# Patient Record
Sex: Male | Born: 1950
Health system: Midwestern US, Community
[De-identification: ages and names within clinical notes are randomized; demographics above are authoritative.]

## PROBLEM LIST (undated history)

## (undated) DIAGNOSIS — Z87442 Personal history of urinary calculi: Secondary | ICD-10-CM

## (undated) DIAGNOSIS — I10 Essential (primary) hypertension: Secondary | ICD-10-CM

## (undated) DIAGNOSIS — G4730 Sleep apnea, unspecified: Secondary | ICD-10-CM

## (undated) HISTORY — PX: OTHER SURGICAL HISTORY: SHX169

---

## 2005-09-26 ENCOUNTER — Ambulatory Visit (HOSPITAL_COMMUNITY): Admission: RE | Admit: 2005-09-26 | Discharge: 2005-09-26 | Payer: Self-pay | Admitting: Orthopedic Surgery

## 2010-12-04 ENCOUNTER — Ambulatory Visit
Admission: RE | Admit: 2010-12-04 | Discharge: 2010-12-04 | Disposition: A | Discharge status: Home / Self Care | Payer: 59 | Source: Ambulatory Visit | Attending: Radiation Oncology | Admitting: Radiation Oncology

## 2010-12-04 DIAGNOSIS — Y842 Radiological procedure and radiotherapy as the cause of abnormal reaction of the patient, or of later complication, without mention of misadventure at the time of the procedure: Secondary | ICD-10-CM | POA: Insufficient documentation

## 2010-12-04 DIAGNOSIS — C61 Malignant neoplasm of prostate: Secondary | ICD-10-CM | POA: Insufficient documentation

## 2010-12-04 DIAGNOSIS — N342 Other urethritis: Secondary | ICD-10-CM | POA: Insufficient documentation

## 2010-12-27 ENCOUNTER — Other Ambulatory Visit: Payer: Self-pay | Admitting: Urology

## 2010-12-27 ENCOUNTER — Ambulatory Visit (HOSPITAL_BASED_OUTPATIENT_CLINIC_OR_DEPARTMENT_OTHER)
Admission: RE | Admit: 2010-12-27 | Discharge: 2010-12-27 | Disposition: A | Discharge status: Home / Self Care | Payer: 59 | Source: Ambulatory Visit | Attending: Urology | Admitting: Urology

## 2010-12-27 ENCOUNTER — Ambulatory Visit
Admission: RE | Admit: 2010-12-27 | Discharge: 2010-12-27 | Disposition: A | Discharge status: Home / Self Care | Payer: 59 | Source: Ambulatory Visit | Attending: Urology | Admitting: Urology

## 2010-12-27 DIAGNOSIS — C61 Malignant neoplasm of prostate: Secondary | ICD-10-CM | POA: Insufficient documentation

## 2010-12-27 DIAGNOSIS — Z01811 Encounter for preprocedural respiratory examination: Secondary | ICD-10-CM

## 2011-01-16 LAB — PROTIME-INR
INR: 0.93 (ref 0.00–1.49)
Prothrombin Time: 12.7 seconds (ref 11.6–15.2)

## 2011-01-16 LAB — COMPREHENSIVE METABOLIC PANEL
ALT: 41 U/L (ref 0–53)
Albumin: 4.5 g/dL (ref 3.5–5.2)
Alkaline Phosphatase: 72 U/L (ref 39–117)
CO2: 29 mEq/L (ref 19–32)
Calcium: 10.5 mg/dL (ref 8.4–10.5)
GFR calc Af Amer: >60 mL/min (ref >60)
GFR calc non Af Amer: >60 mL/min (ref >60)
Glucose, Bld: 100 mg/dL — ABNORMAL HIGH (ref 70–99)
Potassium: 4.6 mEq/L (ref 3.5–5.1)
Sodium: 137 mEq/L (ref 135–145)

## 2011-01-16 LAB — CBC
Hemoglobin: 16.1 g/dL (ref 13.0–17.0)
MCH: 33.3 pg (ref 26.0–34.0)
RDW: 12.9 % (ref 11.5–15.5)

## 2011-01-16 LAB — APTT: aPTT: 33 seconds (ref 24–37)

## 2011-01-23 ENCOUNTER — Ambulatory Visit (HOSPITAL_BASED_OUTPATIENT_CLINIC_OR_DEPARTMENT_OTHER)
Admission: RE | Admit: 2011-01-23 | Discharge: 2011-01-23 | Disposition: A | Discharge status: Home / Self Care | Payer: 59 | Source: Ambulatory Visit | Attending: Urology | Admitting: Urology

## 2011-01-23 ENCOUNTER — Ambulatory Visit (HOSPITAL_COMMUNITY): Payer: 59 | Attending: Urology

## 2011-01-23 DIAGNOSIS — G4733 Obstructive sleep apnea (adult) (pediatric): Secondary | ICD-10-CM | POA: Insufficient documentation

## 2011-01-23 DIAGNOSIS — Z01812 Encounter for preprocedural laboratory examination: Secondary | ICD-10-CM | POA: Insufficient documentation

## 2011-01-23 DIAGNOSIS — Z79899 Other long term (current) drug therapy: Secondary | ICD-10-CM | POA: Insufficient documentation

## 2011-01-23 DIAGNOSIS — F17200 Nicotine dependence, unspecified, uncomplicated: Secondary | ICD-10-CM | POA: Insufficient documentation

## 2011-01-23 DIAGNOSIS — C61 Malignant neoplasm of prostate: Secondary | ICD-10-CM | POA: Insufficient documentation

## 2011-01-23 DIAGNOSIS — Z8042 Family history of malignant neoplasm of prostate: Secondary | ICD-10-CM | POA: Insufficient documentation

## 2011-01-23 DIAGNOSIS — I10 Essential (primary) hypertension: Secondary | ICD-10-CM | POA: Insufficient documentation

## 2011-01-23 DIAGNOSIS — Z01810 Encounter for preprocedural cardiovascular examination: Secondary | ICD-10-CM | POA: Insufficient documentation

## 2011-04-23 ENCOUNTER — Encounter: Payer: Self-pay | Admitting: *Deleted

## 2011-04-23 NOTE — Progress Notes (Unsigned)
ON 12/04/10 I=PSS RESULTS= 0100001 SCORE=2

## 2015-08-16 DIAGNOSIS — Z7689 Persons encountering health services in other specified circumstances: Secondary | ICD-10-CM | POA: Diagnosis not present

## 2015-08-16 DIAGNOSIS — C61 Malignant neoplasm of prostate: Secondary | ICD-10-CM | POA: Diagnosis not present

## 2015-08-16 DIAGNOSIS — N529 Male erectile dysfunction, unspecified: Secondary | ICD-10-CM | POA: Diagnosis not present

## 2015-09-26 DIAGNOSIS — Z1389 Encounter for screening for other disorder: Secondary | ICD-10-CM | POA: Diagnosis not present

## 2015-09-26 DIAGNOSIS — Z79899 Other long term (current) drug therapy: Secondary | ICD-10-CM | POA: Diagnosis not present

## 2015-09-26 DIAGNOSIS — G4730 Sleep apnea, unspecified: Secondary | ICD-10-CM | POA: Diagnosis not present

## 2015-09-26 DIAGNOSIS — Z8546 Personal history of malignant neoplasm of prostate: Secondary | ICD-10-CM | POA: Diagnosis not present

## 2015-09-26 DIAGNOSIS — F17200 Nicotine dependence, unspecified, uncomplicated: Secondary | ICD-10-CM | POA: Diagnosis not present

## 2015-09-26 DIAGNOSIS — E663 Overweight: Secondary | ICD-10-CM | POA: Diagnosis not present

## 2015-09-26 DIAGNOSIS — E785 Hyperlipidemia, unspecified: Secondary | ICD-10-CM | POA: Diagnosis not present

## 2015-09-26 DIAGNOSIS — M109 Gout, unspecified: Secondary | ICD-10-CM | POA: Diagnosis not present

## 2015-09-26 DIAGNOSIS — N529 Male erectile dysfunction, unspecified: Secondary | ICD-10-CM | POA: Diagnosis not present

## 2015-09-26 DIAGNOSIS — I10 Essential (primary) hypertension: Secondary | ICD-10-CM | POA: Diagnosis not present

## 2015-09-26 DIAGNOSIS — R739 Hyperglycemia, unspecified: Secondary | ICD-10-CM | POA: Diagnosis not present

## 2015-09-26 DIAGNOSIS — Z0000 Encounter for general adult medical examination without abnormal findings: Secondary | ICD-10-CM | POA: Diagnosis not present

## 2015-09-28 DIAGNOSIS — G4733 Obstructive sleep apnea (adult) (pediatric): Secondary | ICD-10-CM | POA: Diagnosis not present

## 2015-12-25 DIAGNOSIS — S239XXA Sprain of unspecified parts of thorax, initial encounter: Secondary | ICD-10-CM | POA: Diagnosis not present

## 2016-01-23 DIAGNOSIS — R05 Cough: Secondary | ICD-10-CM | POA: Diagnosis not present

## 2016-01-23 DIAGNOSIS — R0602 Shortness of breath: Secondary | ICD-10-CM | POA: Diagnosis not present

## 2016-01-23 DIAGNOSIS — R509 Fever, unspecified: Secondary | ICD-10-CM | POA: Diagnosis not present

## 2016-02-15 DIAGNOSIS — C61 Malignant neoplasm of prostate: Secondary | ICD-10-CM | POA: Diagnosis not present

## 2016-02-15 DIAGNOSIS — N529 Male erectile dysfunction, unspecified: Secondary | ICD-10-CM | POA: Diagnosis not present

## 2016-08-15 DIAGNOSIS — C61 Malignant neoplasm of prostate: Secondary | ICD-10-CM | POA: Diagnosis not present

## 2016-08-15 DIAGNOSIS — N3289 Other specified disorders of bladder: Secondary | ICD-10-CM | POA: Diagnosis not present

## 2016-09-26 DIAGNOSIS — E785 Hyperlipidemia, unspecified: Secondary | ICD-10-CM | POA: Diagnosis not present

## 2016-09-26 DIAGNOSIS — Z8546 Personal history of malignant neoplasm of prostate: Secondary | ICD-10-CM | POA: Diagnosis not present

## 2016-09-26 DIAGNOSIS — F17210 Nicotine dependence, cigarettes, uncomplicated: Secondary | ICD-10-CM | POA: Diagnosis not present

## 2016-09-26 DIAGNOSIS — E663 Overweight: Secondary | ICD-10-CM | POA: Diagnosis not present

## 2016-09-26 DIAGNOSIS — M109 Gout, unspecified: Secondary | ICD-10-CM | POA: Diagnosis not present

## 2016-09-26 DIAGNOSIS — I10 Essential (primary) hypertension: Secondary | ICD-10-CM | POA: Diagnosis not present

## 2016-09-26 DIAGNOSIS — L821 Other seborrheic keratosis: Secondary | ICD-10-CM | POA: Diagnosis not present

## 2016-09-26 DIAGNOSIS — Z6828 Body mass index (BMI) 28.0-28.9, adult: Secondary | ICD-10-CM | POA: Diagnosis not present

## 2016-09-26 DIAGNOSIS — Z0000 Encounter for general adult medical examination without abnormal findings: Secondary | ICD-10-CM | POA: Diagnosis not present

## 2016-09-26 DIAGNOSIS — G4730 Sleep apnea, unspecified: Secondary | ICD-10-CM | POA: Diagnosis not present

## 2016-11-13 DIAGNOSIS — L219 Seborrheic dermatitis, unspecified: Secondary | ICD-10-CM | POA: Diagnosis not present

## 2016-11-13 DIAGNOSIS — L821 Other seborrheic keratosis: Secondary | ICD-10-CM | POA: Diagnosis not present

## 2016-11-13 DIAGNOSIS — D220 Melanocytic nevi of lip: Secondary | ICD-10-CM | POA: Diagnosis not present

## 2016-11-29 DIAGNOSIS — M1711 Unilateral primary osteoarthritis, right knee: Secondary | ICD-10-CM | POA: Diagnosis not present

## 2017-02-14 DIAGNOSIS — C61 Malignant neoplasm of prostate: Secondary | ICD-10-CM | POA: Diagnosis not present

## 2017-02-14 DIAGNOSIS — N3289 Other specified disorders of bladder: Secondary | ICD-10-CM | POA: Diagnosis not present

## 2017-03-14 DIAGNOSIS — M1711 Unilateral primary osteoarthritis, right knee: Secondary | ICD-10-CM | POA: Diagnosis not present

## 2017-04-03 DIAGNOSIS — H00019 Hordeolum externum unspecified eye, unspecified eyelid: Secondary | ICD-10-CM | POA: Diagnosis not present

## 2017-04-16 DIAGNOSIS — H0014 Chalazion left upper eyelid: Secondary | ICD-10-CM | POA: Diagnosis not present

## 2017-06-26 DIAGNOSIS — M1711 Unilateral primary osteoarthritis, right knee: Secondary | ICD-10-CM | POA: Diagnosis not present

## 2017-06-26 DIAGNOSIS — M170 Bilateral primary osteoarthritis of knee: Secondary | ICD-10-CM | POA: Diagnosis not present

## 2017-08-15 DIAGNOSIS — N529 Male erectile dysfunction, unspecified: Secondary | ICD-10-CM | POA: Diagnosis not present

## 2017-08-15 DIAGNOSIS — N3289 Other specified disorders of bladder: Secondary | ICD-10-CM | POA: Diagnosis not present

## 2017-08-15 DIAGNOSIS — C61 Malignant neoplasm of prostate: Secondary | ICD-10-CM | POA: Diagnosis not present

## 2017-08-26 DIAGNOSIS — M1711 Unilateral primary osteoarthritis, right knee: Secondary | ICD-10-CM | POA: Diagnosis not present

## 2017-09-16 DIAGNOSIS — M1711 Unilateral primary osteoarthritis, right knee: Secondary | ICD-10-CM | POA: Diagnosis not present

## 2017-09-23 DIAGNOSIS — S83241D Other tear of medial meniscus, current injury, right knee, subsequent encounter: Secondary | ICD-10-CM | POA: Diagnosis not present

## 2017-09-23 DIAGNOSIS — M1711 Unilateral primary osteoarthritis, right knee: Secondary | ICD-10-CM | POA: Diagnosis not present

## 2017-09-25 DIAGNOSIS — G4733 Obstructive sleep apnea (adult) (pediatric): Secondary | ICD-10-CM | POA: Diagnosis not present

## 2017-10-10 DIAGNOSIS — R739 Hyperglycemia, unspecified: Secondary | ICD-10-CM | POA: Diagnosis not present

## 2017-10-10 DIAGNOSIS — E663 Overweight: Secondary | ICD-10-CM | POA: Diagnosis not present

## 2017-10-10 DIAGNOSIS — Z6827 Body mass index (BMI) 27.0-27.9, adult: Secondary | ICD-10-CM | POA: Diagnosis not present

## 2017-10-10 DIAGNOSIS — I10 Essential (primary) hypertension: Secondary | ICD-10-CM | POA: Diagnosis not present

## 2017-10-10 DIAGNOSIS — Z01818 Encounter for other preprocedural examination: Secondary | ICD-10-CM | POA: Diagnosis not present

## 2017-10-10 DIAGNOSIS — E785 Hyperlipidemia, unspecified: Secondary | ICD-10-CM | POA: Diagnosis not present

## 2017-10-10 DIAGNOSIS — M109 Gout, unspecified: Secondary | ICD-10-CM | POA: Diagnosis not present

## 2017-10-10 DIAGNOSIS — F17200 Nicotine dependence, unspecified, uncomplicated: Secondary | ICD-10-CM | POA: Diagnosis not present

## 2017-10-10 DIAGNOSIS — Z0000 Encounter for general adult medical examination without abnormal findings: Secondary | ICD-10-CM | POA: Diagnosis not present

## 2017-10-10 DIAGNOSIS — Z79899 Other long term (current) drug therapy: Secondary | ICD-10-CM | POA: Diagnosis not present

## 2017-10-10 DIAGNOSIS — G4730 Sleep apnea, unspecified: Secondary | ICD-10-CM | POA: Diagnosis not present

## 2017-10-10 DIAGNOSIS — Z8546 Personal history of malignant neoplasm of prostate: Secondary | ICD-10-CM | POA: Diagnosis not present

## 2017-10-27 DIAGNOSIS — M948X6 Other specified disorders of cartilage, lower leg: Secondary | ICD-10-CM | POA: Diagnosis not present

## 2017-10-27 DIAGNOSIS — Y999 Unspecified external cause status: Secondary | ICD-10-CM | POA: Diagnosis not present

## 2017-10-27 DIAGNOSIS — G8918 Other acute postprocedural pain: Secondary | ICD-10-CM | POA: Diagnosis not present

## 2017-10-27 DIAGNOSIS — X58XXXA Exposure to other specified factors, initial encounter: Secondary | ICD-10-CM | POA: Diagnosis not present

## 2017-10-27 DIAGNOSIS — M1711 Unilateral primary osteoarthritis, right knee: Secondary | ICD-10-CM | POA: Diagnosis not present

## 2017-10-27 DIAGNOSIS — S83231A Complex tear of medial meniscus, current injury, right knee, initial encounter: Secondary | ICD-10-CM | POA: Diagnosis not present

## 2017-10-27 DIAGNOSIS — S83281A Other tear of lateral meniscus, current injury, right knee, initial encounter: Secondary | ICD-10-CM | POA: Diagnosis not present

## 2017-12-12 DIAGNOSIS — M1712 Unilateral primary osteoarthritis, left knee: Secondary | ICD-10-CM | POA: Diagnosis not present

## 2017-12-12 DIAGNOSIS — M1711 Unilateral primary osteoarthritis, right knee: Secondary | ICD-10-CM | POA: Diagnosis not present

## 2017-12-12 DIAGNOSIS — Z4789 Encounter for other orthopedic aftercare: Secondary | ICD-10-CM | POA: Diagnosis not present

## 2018-02-03 DIAGNOSIS — M1711 Unilateral primary osteoarthritis, right knee: Secondary | ICD-10-CM | POA: Diagnosis not present

## 2018-02-10 DIAGNOSIS — M1711 Unilateral primary osteoarthritis, right knee: Secondary | ICD-10-CM | POA: Diagnosis not present

## 2018-02-16 DIAGNOSIS — N529 Male erectile dysfunction, unspecified: Secondary | ICD-10-CM | POA: Diagnosis not present

## 2018-02-16 DIAGNOSIS — N3289 Other specified disorders of bladder: Secondary | ICD-10-CM | POA: Diagnosis not present

## 2018-02-16 DIAGNOSIS — C61 Malignant neoplasm of prostate: Secondary | ICD-10-CM | POA: Diagnosis not present

## 2018-02-17 DIAGNOSIS — M1711 Unilateral primary osteoarthritis, right knee: Secondary | ICD-10-CM | POA: Diagnosis not present

## 2018-04-24 DIAGNOSIS — G4733 Obstructive sleep apnea (adult) (pediatric): Secondary | ICD-10-CM | POA: Diagnosis not present

## 2018-06-15 DIAGNOSIS — L820 Inflamed seborrheic keratosis: Secondary | ICD-10-CM | POA: Diagnosis not present

## 2018-06-15 DIAGNOSIS — D220 Melanocytic nevi of lip: Secondary | ICD-10-CM | POA: Diagnosis not present

## 2018-06-21 DIAGNOSIS — K5732 Diverticulitis of large intestine without perforation or abscess without bleeding: Secondary | ICD-10-CM | POA: Diagnosis not present

## 2018-07-08 DIAGNOSIS — N3001 Acute cystitis with hematuria: Secondary | ICD-10-CM | POA: Diagnosis not present

## 2018-07-08 DIAGNOSIS — Z6828 Body mass index (BMI) 28.0-28.9, adult: Secondary | ICD-10-CM | POA: Diagnosis not present

## 2018-07-08 DIAGNOSIS — M545 Low back pain: Secondary | ICD-10-CM | POA: Diagnosis not present

## 2018-07-08 DIAGNOSIS — K5900 Constipation, unspecified: Secondary | ICD-10-CM | POA: Diagnosis not present

## 2018-07-22 DIAGNOSIS — E663 Overweight: Secondary | ICD-10-CM | POA: Diagnosis not present

## 2018-07-22 DIAGNOSIS — K5900 Constipation, unspecified: Secondary | ICD-10-CM | POA: Diagnosis not present

## 2018-07-22 DIAGNOSIS — Z6828 Body mass index (BMI) 28.0-28.9, adult: Secondary | ICD-10-CM | POA: Diagnosis not present

## 2018-07-22 DIAGNOSIS — F17200 Nicotine dependence, unspecified, uncomplicated: Secondary | ICD-10-CM | POA: Diagnosis not present

## 2018-07-22 DIAGNOSIS — Z9181 History of falling: Secondary | ICD-10-CM | POA: Diagnosis not present

## 2018-07-22 DIAGNOSIS — Z8546 Personal history of malignant neoplasm of prostate: Secondary | ICD-10-CM | POA: Diagnosis not present

## 2018-07-22 DIAGNOSIS — M545 Low back pain: Secondary | ICD-10-CM | POA: Diagnosis not present

## 2018-07-22 DIAGNOSIS — Z1331 Encounter for screening for depression: Secondary | ICD-10-CM | POA: Diagnosis not present

## 2018-07-22 DIAGNOSIS — I10 Essential (primary) hypertension: Secondary | ICD-10-CM | POA: Diagnosis not present

## 2018-08-12 DIAGNOSIS — I10 Essential (primary) hypertension: Secondary | ICD-10-CM | POA: Diagnosis not present

## 2018-08-12 DIAGNOSIS — R29898 Other symptoms and signs involving the musculoskeletal system: Secondary | ICD-10-CM | POA: Diagnosis not present

## 2018-08-12 DIAGNOSIS — Z789 Other specified health status: Secondary | ICD-10-CM | POA: Diagnosis not present

## 2018-08-12 DIAGNOSIS — I129 Hypertensive chronic kidney disease with stage 1 through stage 4 chronic kidney disease, or unspecified chronic kidney disease: Secondary | ICD-10-CM | POA: Diagnosis not present

## 2018-08-12 DIAGNOSIS — N419 Inflammatory disease of prostate, unspecified: Secondary | ICD-10-CM | POA: Diagnosis not present

## 2018-08-12 DIAGNOSIS — Z6828 Body mass index (BMI) 28.0-28.9, adult: Secondary | ICD-10-CM | POA: Diagnosis not present

## 2018-09-03 DIAGNOSIS — N3289 Other specified disorders of bladder: Secondary | ICD-10-CM | POA: Diagnosis not present

## 2018-09-03 DIAGNOSIS — C61 Malignant neoplasm of prostate: Secondary | ICD-10-CM | POA: Diagnosis not present

## 2018-09-03 DIAGNOSIS — R1032 Left lower quadrant pain: Secondary | ICD-10-CM | POA: Diagnosis not present

## 2018-09-04 DIAGNOSIS — N200 Calculus of kidney: Secondary | ICD-10-CM | POA: Diagnosis not present

## 2018-09-04 DIAGNOSIS — R1032 Left lower quadrant pain: Secondary | ICD-10-CM | POA: Diagnosis not present

## 2018-09-15 DIAGNOSIS — R109 Unspecified abdominal pain: Secondary | ICD-10-CM | POA: Diagnosis not present

## 2018-09-15 DIAGNOSIS — N200 Calculus of kidney: Secondary | ICD-10-CM | POA: Diagnosis not present

## 2018-09-18 DIAGNOSIS — N200 Calculus of kidney: Secondary | ICD-10-CM | POA: Diagnosis not present

## 2018-09-24 DIAGNOSIS — F17210 Nicotine dependence, cigarettes, uncomplicated: Secondary | ICD-10-CM | POA: Diagnosis not present

## 2018-09-24 DIAGNOSIS — F17200 Nicotine dependence, unspecified, uncomplicated: Secondary | ICD-10-CM | POA: Diagnosis not present

## 2018-09-24 DIAGNOSIS — R1032 Left lower quadrant pain: Secondary | ICD-10-CM | POA: Diagnosis not present

## 2018-09-24 DIAGNOSIS — N200 Calculus of kidney: Secondary | ICD-10-CM | POA: Diagnosis not present

## 2018-09-24 DIAGNOSIS — N390 Urinary tract infection, site not specified: Secondary | ICD-10-CM | POA: Diagnosis not present

## 2018-09-24 DIAGNOSIS — Z6828 Body mass index (BMI) 28.0-28.9, adult: Secondary | ICD-10-CM | POA: Diagnosis not present

## 2018-09-25 DIAGNOSIS — R1032 Left lower quadrant pain: Secondary | ICD-10-CM | POA: Diagnosis not present

## 2018-09-25 DIAGNOSIS — N401 Enlarged prostate with lower urinary tract symptoms: Secondary | ICD-10-CM | POA: Diagnosis not present

## 2018-09-25 DIAGNOSIS — N200 Calculus of kidney: Secondary | ICD-10-CM | POA: Diagnosis not present

## 2018-10-20 DIAGNOSIS — Z79899 Other long term (current) drug therapy: Secondary | ICD-10-CM | POA: Diagnosis not present

## 2018-10-20 DIAGNOSIS — F17200 Nicotine dependence, unspecified, uncomplicated: Secondary | ICD-10-CM | POA: Diagnosis not present

## 2018-10-20 DIAGNOSIS — Z0000 Encounter for general adult medical examination without abnormal findings: Secondary | ICD-10-CM | POA: Diagnosis not present

## 2018-10-20 DIAGNOSIS — Z125 Encounter for screening for malignant neoplasm of prostate: Secondary | ICD-10-CM | POA: Diagnosis not present

## 2018-10-20 DIAGNOSIS — Z8546 Personal history of malignant neoplasm of prostate: Secondary | ICD-10-CM | POA: Diagnosis not present

## 2018-10-20 DIAGNOSIS — F17210 Nicotine dependence, cigarettes, uncomplicated: Secondary | ICD-10-CM | POA: Diagnosis not present

## 2018-10-20 DIAGNOSIS — G4730 Sleep apnea, unspecified: Secondary | ICD-10-CM | POA: Diagnosis not present

## 2018-10-20 DIAGNOSIS — E785 Hyperlipidemia, unspecified: Secondary | ICD-10-CM | POA: Diagnosis not present

## 2018-10-20 DIAGNOSIS — M109 Gout, unspecified: Secondary | ICD-10-CM | POA: Diagnosis not present

## 2018-10-20 DIAGNOSIS — E663 Overweight: Secondary | ICD-10-CM | POA: Diagnosis not present

## 2018-10-20 DIAGNOSIS — I10 Essential (primary) hypertension: Secondary | ICD-10-CM | POA: Diagnosis not present

## 2018-10-20 DIAGNOSIS — Z6827 Body mass index (BMI) 27.0-27.9, adult: Secondary | ICD-10-CM | POA: Diagnosis not present

## 2018-10-29 DIAGNOSIS — C61 Malignant neoplasm of prostate: Secondary | ICD-10-CM | POA: Diagnosis not present

## 2018-10-29 DIAGNOSIS — N3289 Other specified disorders of bladder: Secondary | ICD-10-CM | POA: Diagnosis not present

## 2018-10-29 DIAGNOSIS — N200 Calculus of kidney: Secondary | ICD-10-CM | POA: Diagnosis not present

## 2018-12-22 DIAGNOSIS — D485 Neoplasm of uncertain behavior of skin: Secondary | ICD-10-CM | POA: Diagnosis not present

## 2018-12-22 DIAGNOSIS — L814 Other melanin hyperpigmentation: Secondary | ICD-10-CM | POA: Diagnosis not present

## 2018-12-22 DIAGNOSIS — L820 Inflamed seborrheic keratosis: Secondary | ICD-10-CM | POA: Diagnosis not present

## 2019-01-19 DIAGNOSIS — M1711 Unilateral primary osteoarthritis, right knee: Secondary | ICD-10-CM | POA: Diagnosis not present

## 2019-01-26 DIAGNOSIS — M1711 Unilateral primary osteoarthritis, right knee: Secondary | ICD-10-CM | POA: Diagnosis not present

## 2019-02-02 DIAGNOSIS — M1711 Unilateral primary osteoarthritis, right knee: Secondary | ICD-10-CM | POA: Diagnosis not present

## 2019-02-07 DIAGNOSIS — R1084 Generalized abdominal pain: Secondary | ICD-10-CM | POA: Diagnosis not present

## 2019-03-01 DIAGNOSIS — N200 Calculus of kidney: Secondary | ICD-10-CM | POA: Diagnosis not present

## 2019-03-01 DIAGNOSIS — C61 Malignant neoplasm of prostate: Secondary | ICD-10-CM | POA: Diagnosis not present

## 2019-06-07 DIAGNOSIS — E663 Overweight: Secondary | ICD-10-CM | POA: Diagnosis not present

## 2019-06-07 DIAGNOSIS — Z8546 Personal history of malignant neoplasm of prostate: Secondary | ICD-10-CM | POA: Diagnosis not present

## 2019-06-07 DIAGNOSIS — Z6828 Body mass index (BMI) 28.0-28.9, adult: Secondary | ICD-10-CM | POA: Diagnosis not present

## 2019-06-07 DIAGNOSIS — G4730 Sleep apnea, unspecified: Secondary | ICD-10-CM | POA: Diagnosis not present

## 2019-06-07 DIAGNOSIS — F17200 Nicotine dependence, unspecified, uncomplicated: Secondary | ICD-10-CM | POA: Diagnosis not present

## 2019-06-07 DIAGNOSIS — F17210 Nicotine dependence, cigarettes, uncomplicated: Secondary | ICD-10-CM | POA: Diagnosis not present

## 2019-06-07 DIAGNOSIS — K5792 Diverticulitis of intestine, part unspecified, without perforation or abscess without bleeding: Secondary | ICD-10-CM | POA: Diagnosis not present

## 2019-08-09 DIAGNOSIS — M1711 Unilateral primary osteoarthritis, right knee: Secondary | ICD-10-CM | POA: Diagnosis not present

## 2019-08-16 DIAGNOSIS — M1711 Unilateral primary osteoarthritis, right knee: Secondary | ICD-10-CM | POA: Diagnosis not present

## 2019-08-23 DIAGNOSIS — M1711 Unilateral primary osteoarthritis, right knee: Secondary | ICD-10-CM | POA: Diagnosis not present

## 2019-08-30 DIAGNOSIS — N200 Calculus of kidney: Secondary | ICD-10-CM | POA: Diagnosis not present

## 2019-08-30 DIAGNOSIS — C61 Malignant neoplasm of prostate: Secondary | ICD-10-CM | POA: Diagnosis not present

## 2019-10-02 DIAGNOSIS — K5700 Diverticulitis of small intestine with perforation and abscess without bleeding: Secondary | ICD-10-CM | POA: Diagnosis not present

## 2019-10-02 DIAGNOSIS — R1084 Generalized abdominal pain: Secondary | ICD-10-CM | POA: Diagnosis not present

## 2019-10-03 DIAGNOSIS — K5732 Diverticulitis of large intestine without perforation or abscess without bleeding: Secondary | ICD-10-CM | POA: Diagnosis not present

## 2019-11-05 DIAGNOSIS — R21 Rash and other nonspecific skin eruption: Secondary | ICD-10-CM | POA: Diagnosis not present

## 2019-11-05 DIAGNOSIS — Z6828 Body mass index (BMI) 28.0-28.9, adult: Secondary | ICD-10-CM | POA: Diagnosis not present

## 2019-11-05 DIAGNOSIS — G4730 Sleep apnea, unspecified: Secondary | ICD-10-CM | POA: Diagnosis not present

## 2019-11-05 DIAGNOSIS — E663 Overweight: Secondary | ICD-10-CM | POA: Diagnosis not present

## 2019-11-05 DIAGNOSIS — E785 Hyperlipidemia, unspecified: Secondary | ICD-10-CM | POA: Diagnosis not present

## 2019-11-05 DIAGNOSIS — M109 Gout, unspecified: Secondary | ICD-10-CM | POA: Diagnosis not present

## 2019-11-05 DIAGNOSIS — I10 Essential (primary) hypertension: Secondary | ICD-10-CM | POA: Diagnosis not present

## 2019-11-05 DIAGNOSIS — F17200 Nicotine dependence, unspecified, uncomplicated: Secondary | ICD-10-CM | POA: Diagnosis not present

## 2019-11-05 DIAGNOSIS — Z0000 Encounter for general adult medical examination without abnormal findings: Secondary | ICD-10-CM | POA: Diagnosis not present

## 2019-11-05 DIAGNOSIS — R739 Hyperglycemia, unspecified: Secondary | ICD-10-CM | POA: Diagnosis not present

## 2019-11-05 DIAGNOSIS — Z8546 Personal history of malignant neoplasm of prostate: Secondary | ICD-10-CM | POA: Diagnosis not present

## 2019-11-05 DIAGNOSIS — Z79899 Other long term (current) drug therapy: Secondary | ICD-10-CM | POA: Diagnosis not present

## 2019-11-05 DIAGNOSIS — F17210 Nicotine dependence, cigarettes, uncomplicated: Secondary | ICD-10-CM | POA: Diagnosis not present

## 2019-11-24 DIAGNOSIS — K5730 Diverticulosis of large intestine without perforation or abscess without bleeding: Secondary | ICD-10-CM | POA: Diagnosis not present

## 2019-11-24 DIAGNOSIS — Z1211 Encounter for screening for malignant neoplasm of colon: Secondary | ICD-10-CM | POA: Diagnosis not present

## 2019-11-24 DIAGNOSIS — Z86010 Personal history of colonic polyps: Secondary | ICD-10-CM | POA: Diagnosis not present

## 2019-12-14 DIAGNOSIS — M25561 Pain in right knee: Secondary | ICD-10-CM | POA: Diagnosis not present

## 2019-12-14 DIAGNOSIS — M25461 Effusion, right knee: Secondary | ICD-10-CM | POA: Diagnosis not present

## 2019-12-14 DIAGNOSIS — M1711 Unilateral primary osteoarthritis, right knee: Secondary | ICD-10-CM | POA: Diagnosis not present

## 2019-12-27 DIAGNOSIS — L219 Seborrheic dermatitis, unspecified: Secondary | ICD-10-CM | POA: Diagnosis not present

## 2019-12-27 DIAGNOSIS — L820 Inflamed seborrheic keratosis: Secondary | ICD-10-CM | POA: Diagnosis not present

## 2019-12-27 DIAGNOSIS — L301 Dyshidrosis [pompholyx]: Secondary | ICD-10-CM | POA: Diagnosis not present

## 2019-12-28 DIAGNOSIS — M1711 Unilateral primary osteoarthritis, right knee: Secondary | ICD-10-CM | POA: Diagnosis not present

## 2020-01-31 ENCOUNTER — Other Ambulatory Visit: Payer: Self-pay | Admitting: Orthopedic Surgery

## 2020-02-14 ENCOUNTER — Encounter (HOSPITAL_COMMUNITY): Payer: Self-pay

## 2020-02-21 ENCOUNTER — Ambulatory Visit: Admit: 2020-02-21 | Payer: Self-pay | Admitting: Orthopedic Surgery

## 2020-02-21 SURGERY — ARTHROPLASTY, KNEE, TOTAL
Anesthesia: Spinal | Site: Knee | Laterality: Right

## 2020-02-29 DIAGNOSIS — C61 Malignant neoplasm of prostate: Secondary | ICD-10-CM | POA: Diagnosis not present

## 2020-02-29 DIAGNOSIS — N200 Calculus of kidney: Secondary | ICD-10-CM | POA: Diagnosis not present

## 2020-03-02 DIAGNOSIS — I878 Other specified disorders of veins: Secondary | ICD-10-CM | POA: Diagnosis not present

## 2020-03-02 DIAGNOSIS — Z87442 Personal history of urinary calculi: Secondary | ICD-10-CM | POA: Diagnosis not present

## 2020-03-02 DIAGNOSIS — N200 Calculus of kidney: Secondary | ICD-10-CM | POA: Diagnosis not present

## 2020-04-16 DIAGNOSIS — R051 Acute cough: Secondary | ICD-10-CM | POA: Diagnosis not present

## 2020-04-16 DIAGNOSIS — R9081 Abnormal echoencephalogram: Secondary | ICD-10-CM | POA: Diagnosis not present

## 2020-04-16 DIAGNOSIS — J309 Allergic rhinitis, unspecified: Secondary | ICD-10-CM | POA: Diagnosis not present

## 2020-05-04 DIAGNOSIS — M1711 Unilateral primary osteoarthritis, right knee: Secondary | ICD-10-CM | POA: Diagnosis not present

## 2020-08-29 DIAGNOSIS — N200 Calculus of kidney: Secondary | ICD-10-CM | POA: Diagnosis not present

## 2020-08-29 DIAGNOSIS — C61 Malignant neoplasm of prostate: Secondary | ICD-10-CM | POA: Diagnosis not present

## 2020-11-05 DIAGNOSIS — M109 Gout, unspecified: Secondary | ICD-10-CM | POA: Diagnosis not present

## 2020-11-05 DIAGNOSIS — M25562 Pain in left knee: Secondary | ICD-10-CM | POA: Diagnosis not present

## 2020-11-10 ENCOUNTER — Ambulatory Visit: Attending: Orthopaedic Surgery

## 2020-11-10 ENCOUNTER — Encounter: Admit: 2020-11-10 | Payer: PRIVATE HEALTH INSURANCE

## 2020-11-10 ENCOUNTER — Ambulatory Visit: Admit: 2020-11-10 | Discharge: 2020-11-10 | Payer: PRIVATE HEALTH INSURANCE | Attending: Orthopaedic Surgery

## 2020-11-10 DIAGNOSIS — M25562 Pain in left knee: Secondary | ICD-10-CM

## 2020-11-10 DIAGNOSIS — M11262 Other chondrocalcinosis, left knee: Secondary | ICD-10-CM | POA: Diagnosis not present

## 2020-11-10 DIAGNOSIS — M1712 Unilateral primary osteoarthritis, left knee: Secondary | ICD-10-CM | POA: Diagnosis not present

## 2020-11-10 MED ORDER — BUPIVACAINE 0.5 % (5 MG/ML) IJ SOLN
0.5 % (5 mg/mL) | Freq: Once | INTRAMUSCULAR | Status: AC
Start: 2020-11-10 — End: 2020-11-10
  Administered 2020-11-10: 16:00:00

## 2020-11-10 MED ORDER — TRIAMCINOLONE ACETONIDE 40 MG/ML SUSP FOR INJECTION
40 mg/mL | Freq: Once | INTRAMUSCULAR | Status: AC
Start: 2020-11-10 — End: 2020-11-10
  Administered 2020-11-10: 16:00:00 via INTRA_ARTICULAR

## 2020-11-10 MED ORDER — BUPIVACAINE 0.5 % (5 MG/ML) IJ SOLN
0.5 % (5 mg/mL) | Freq: Once | INTRAMUSCULAR | Status: AC
Start: 2020-11-10 — End: 2020-11-10
  Administered 2020-11-10: 16:00:00 via SUBCUTANEOUS

## 2020-11-10 NOTE — Progress Notes (Signed)
Progress Notes by Harriette Bouillon, PA-C at 11/10/20 1145                Author: Harriette Bouillon, PA-C  Service: --  Author Type: Physician Assistant       Filed: 12/31/20 1811  Encounter Date: 11/10/2020  Status: Signed           Editor: Coral Ceo (Physician Assistant)  Cosigner: Sullivan Lone, MD at 01/03/21 0827                    George Avery (DOB: [birth year]) is a 70 y.o. male, patient, here for evaluation of the following chief complaint(s):   Knee Pain (left)          SUBJECTIVE/OBJECTIVE:   George Avery presents today complaining of left knee pain and swelling.  States he went to urgent care this past weekend.  Had what sounds like a Toradol injection IM with mild relief.         PHYSICAL EXAM:   Vitals: Ht 5\' 11"  (1.803 m)    Wt 190 lb (86.2 kg)    BMI 26.50 kg/m??   Body mass index is 26.5 kg/m??.      70 y.o. year old M in no acute distress.  Ambulates with a limp.  Left knee effusion.  Skin warm, dry.  Motor 5/5.  No distal edema.      IMAGING:   Radiographs: XR Results (most recent) :   Results from Appointment encounter on 11/10/20      XR KNEE LT MIN 4 V      Narrative   Four views (AP, PA-flex, lateral & sunrise) of the left knee were taken and reviewed today using digital radiography which reveal severe patellofemoral osteoarthritis, chondrocalcinosis  noted in the medial and lateral joint spaces.  Incidental finding complete loss of medial joint space right knee.           ASSESSMENT/PLAN:   1. Left knee pain, unspecified chronicity   -     XR KNEE LT MIN 4 V; Future   2. Primary osteoarthritis of left knee   -     bupivacaine HCl (MARCAINE) 0.5 % (5 mg/mL) injection 25 mg; 25 mg (5 mL), Other, ONCE, 1 dose, On Fri 11/10/20 at 1300   -     triamcinolone acetonide (KENALOG-40) 40 mg/mL injection 40 mg; 40 mg, Intra artICUlar, ONCE, 1 dose, On Fri 11/10/20 at 1300   3. Effusion, left knee   -     bupivacaine HCl (MARCAINE) 0.5 % (5 mg/mL) injection 10 mg; 10 mg (2  mL), SubCUTAneous, ONCE, 1 dose, On Fri 11/10/20 at 1300      The xray and exam findings were discussed with the patient today.  Severe left knee osteoarthritis complicated by effusion.  He elects for aspiration and cortisone injection today.  Discussed natural  disease progression and possible need for surgery in the future.  Follow-up as symptoms dictate.      Under sterile conditions, the knee as anesthetized with 2cc  0.5% bupivacaine with a 22g needle.  Knee was then aspirated using an 18g needle, 40 cc of fluid removed, and knee injected with 5cc 0.5%  Sensorcaine and 1cc 40mg  Kenalog, tolerated the procedure well.         Return if symptoms worsen or fail to improve.      Review Of Systems     ROS  Positive for: Musculoskeletal   Last edited by Jaclyn Shaggy, RN on 11/10/2020  1:27 PM.               Patient denies any recent fever, chills, nausea, vomiting, chest pain, or shortness of breath.      No Known Allergies        Current Outpatient Medications        Medication  Sig         ?  allopurinoL (ZYLOPRIM) 300 mg tablet  Take 300 mg by mouth daily.     ?  benazepriL (LOTENSIN) 40 mg tablet  benazepril 40 mg tablet     ?  amLODIPine (NORVASC) 5 mg tablet  amlodipine 5 mg tablet     ?  indomethacin (INDOCIN) 50 mg capsule  TAKE 1 CAPSULE 3 TIMES A DAY         ?  pravastatin (PRAVACHOL) 40 mg tablet            No current facility-administered medications for this visit.             Past Medical History:        Diagnosis  Date         ?  Gout       ?  Hypercholesteremia           ?  Hypertension              History reviewed. No pertinent surgical history.      History reviewed. No pertinent family history.         Social History          Socioeconomic History         ?  Marital status:  UNKNOWN              Spouse name:  Not on file         ?  Number of children:  Not on file     ?  Years of education:  Not on file     ?  Highest education level:  Not on file       Occupational History        ?  Not on  file       Tobacco Use         ?  Smoking status:  Every Day     ?  Smokeless tobacco:  Never       Substance and Sexual Activity         ?  Alcohol use:  Not on file     ?  Drug use:  Not on file     ?  Sexual activity:  Not on file        Other Topics  Concern        ?  Not on file       Social History Narrative        ?  Not on file          Social Determinants of Health          Financial Resource Strain: Not on file     Food Insecurity: Not on file     Transportation Needs: Not on file     Physical Activity: Not on file     Stress: Not on file     Social Connections: Not on file     Intimate Partner Violence: Not on file  Housing Stability: Not on file             Orders Placed This Encounter        ?  XR KNEE LT MIN 4 V              Standing Status:    Future         Number of Occurrences:    1         Standing Expiration Date:    11/11/2021        ?  bupivacaine HCl (MARCAINE) 0.5 % (5 mg/mL) injection 25 mg     ?  triamcinolone acetonide (KENALOG-40) 40 mg/mL injection 40 mg        ?  bupivacaine HCl (MARCAINE) 0.5 % (5 mg/mL) injection 10 mg         Jason R. Rennie Plowman, M.D. was available for immediate consultation as the supervising physician.   An electronic signature was used to authenticate this note.   -- Harriette Bouillon, PA-C

## 2020-11-16 DIAGNOSIS — M1711 Unilateral primary osteoarthritis, right knee: Secondary | ICD-10-CM | POA: Diagnosis not present

## 2020-11-26 DIAGNOSIS — R109 Unspecified abdominal pain: Secondary | ICD-10-CM | POA: Diagnosis not present

## 2020-11-26 DIAGNOSIS — D72829 Elevated white blood cell count, unspecified: Secondary | ICD-10-CM | POA: Diagnosis not present

## 2020-11-26 DIAGNOSIS — R3129 Other microscopic hematuria: Secondary | ICD-10-CM | POA: Diagnosis not present

## 2020-11-26 DIAGNOSIS — R1032 Left lower quadrant pain: Secondary | ICD-10-CM | POA: Diagnosis not present

## 2020-11-26 DIAGNOSIS — Z5321 Procedure and treatment not carried out due to patient leaving prior to being seen by health care provider: Secondary | ICD-10-CM | POA: Diagnosis not present

## 2020-11-26 DIAGNOSIS — R1030 Lower abdominal pain, unspecified: Secondary | ICD-10-CM | POA: Diagnosis not present

## 2020-11-29 DIAGNOSIS — Z6826 Body mass index (BMI) 26.0-26.9, adult: Secondary | ICD-10-CM | POA: Diagnosis not present

## 2020-11-29 DIAGNOSIS — I10 Essential (primary) hypertension: Secondary | ICD-10-CM | POA: Diagnosis not present

## 2020-11-29 DIAGNOSIS — Z0000 Encounter for general adult medical examination without abnormal findings: Secondary | ICD-10-CM | POA: Diagnosis not present

## 2020-11-29 DIAGNOSIS — F17210 Nicotine dependence, cigarettes, uncomplicated: Secondary | ICD-10-CM | POA: Diagnosis not present

## 2020-11-29 DIAGNOSIS — Z8546 Personal history of malignant neoplasm of prostate: Secondary | ICD-10-CM | POA: Diagnosis not present

## 2020-11-29 DIAGNOSIS — M1A09X0 Idiopathic chronic gout, multiple sites, without tophus (tophi): Secondary | ICD-10-CM | POA: Diagnosis not present

## 2020-11-29 DIAGNOSIS — E663 Overweight: Secondary | ICD-10-CM | POA: Diagnosis not present

## 2020-11-29 DIAGNOSIS — G4730 Sleep apnea, unspecified: Secondary | ICD-10-CM | POA: Diagnosis not present

## 2020-11-29 DIAGNOSIS — E785 Hyperlipidemia, unspecified: Secondary | ICD-10-CM | POA: Diagnosis not present

## 2020-12-04 DIAGNOSIS — M1712 Unilateral primary osteoarthritis, left knee: Secondary | ICD-10-CM | POA: Diagnosis not present

## 2020-12-04 DIAGNOSIS — R739 Hyperglycemia, unspecified: Secondary | ICD-10-CM | POA: Diagnosis not present

## 2020-12-04 DIAGNOSIS — E785 Hyperlipidemia, unspecified: Secondary | ICD-10-CM | POA: Diagnosis not present

## 2020-12-12 ENCOUNTER — Other Ambulatory Visit: Payer: Self-pay | Admitting: Orthopedic Surgery

## 2020-12-12 DIAGNOSIS — M25562 Pain in left knee: Secondary | ICD-10-CM

## 2020-12-25 ENCOUNTER — Other Ambulatory Visit: Payer: Self-pay

## 2020-12-25 ENCOUNTER — Ambulatory Visit
Admission: RE | Admit: 2020-12-25 | Discharge: 2020-12-25 | Disposition: A | Discharge status: Home / Self Care | Payer: Self-pay | Source: Ambulatory Visit | Attending: Orthopedic Surgery | Admitting: Orthopedic Surgery

## 2020-12-25 DIAGNOSIS — M25562 Pain in left knee: Secondary | ICD-10-CM | POA: Diagnosis not present

## 2021-01-03 DIAGNOSIS — M25562 Pain in left knee: Secondary | ICD-10-CM | POA: Diagnosis not present

## 2021-01-22 ENCOUNTER — Ambulatory Visit: Payer: Self-pay | Admitting: Orthopedic Surgery

## 2021-01-23 NOTE — Progress Notes (Addendum)
DUE TO COVID-19 ONLY ONE VISITOR IS ALLOWED TO COME WITH YOU AND STAY IN THE WAITING ROOM ONLY DURING PRE OP AND PROCEDURE DAY OF SURGERY.  2 VISITOR  MAY VISIT WITH YOU AFTER SURGERY IN YOUR PRIVATE ROOM DURING VISITING HOURS ONLY!  YOU NEED TO HAVE A COVID 19 TEST ON______AME@_  @_from  8am-3pm _____, THIS TEST MUST BE DONE BEFORE SURGERY,  Covid test is done at [place], [place] [address].  This is a drive thru.  No appt required. Please see map.                 Your procedure is scheduled on:  02/02/2021   Report to [facility] Main  Entrance   Report to admitting at    1100AM     Call this number if you have problems the morning of surgery [phone]    REMEMBER: NO  SOLID FOOD CANDY OR GUM AFTER MIDNIGHT. CLEAR LIQUIDS UNTIL    1000 am       . NOTHING BY MOUTH EXCEPT CLEAR LIQUIDS UNTIL      1000am  . PLEASE FINISH ENSURE DRINK PER SURGEON ORDER  WHICH NEEDS TO BE COMPLETED AT   1000am    .      CLEAR LIQUID DIET   Foods Allowed                                                                    Coffee and tea, regular and decaf                            Fruit ices (not with fruit pulp)                                      Iced Popsicles                                    Carbonated beverages, regular and diet                                    Cranberry, grape and apple juices Sports drinks like Gatorade Lightly seasoned clear broth or consume(fat free) Sugar, honey syrup ___________________________________________________________________      BRUSH YOUR TEETH MORNING OF SURGERY AND RINSE YOUR MOUTH OUT, NO CHEWING GUM CANDY OR MINTS.     Take these medicines the morning of surgery with A SIP OF WATER:    Allopurinol, amlodipine   DO NOT TAKE ANY DIABETIC MEDICATIONS DAY OF YOUR SURGERY                               You may not have any metal on your body including hair pins and              piercings  Do not wear jewelry, make-up,  lotions, powders or perfumes, deodorant  Do not wear nail polish on your fingernails.  Do not shave  48 hours prior to surgery.              Men may shave face and neck.   Do not bring valuables to the hospital. [place].  Contacts, dentures or bridgework may not be worn into surgery.  Leave suitcase in the car. After surgery it may be brought to your room.     Patients discharged the day of surgery will not be allowed to drive home. IF YOU ARE HAVING SURGERY AND GOING HOME THE SAME DAY, YOU MUST HAVE AN ADULT TO DRIVE YOU HOME AND BE WITH YOU FOR 24 HOURS. YOU MAY GO HOME BY TAXI OR UBER OR ORTHERWISE, BUT AN ADULT MUST ACCOMPANY YOU HOME AND STAY WITH YOU FOR 24 HOURS.  Name and phone number of your driver:  Special Instructions: N/A              Please read over the following fact sheets you were given: _____________________________________________________________________  [facility] - Preparing for Surgery Before surgery, you can play an important role.  Because skin is not sterile, your skin needs to be as free of germs as possible.  You can reduce the number of germs on your skin by washing with CHG (chlorahexidine gluconate) soap before surgery.  CHG is an antiseptic cleaner which kills germs and bonds with the skin to continue killing germs even after washing. Please DO NOT use if you have an allergy to CHG or antibacterial soaps.  If your skin becomes reddened/irritated stop using the CHG and inform your nurse when you arrive at Short Stay. Do not shave (including legs and underarms) for at least 48 hours prior to the first CHG shower.  You may shave your face/neck. Please follow these instructions carefully:  1.  Shower with CHG Soap the night before surgery and the  morning of Surgery.  2.  If you choose to wash your hair, wash your hair first as usual with your  normal  shampoo.  3.  After you shampoo, rinse your hair  and body thoroughly to remove the  shampoo.                           4.  Use CHG as you would any other liquid soap.  You can apply chg directly  to the skin and wash                       Gently with a scrungie or clean washcloth.  5.  Apply the CHG Soap to your body ONLY FROM THE NECK DOWN.   Do not use on face/ open                           Wound or open sores. Avoid contact with eyes, ears mouth and genitals (private parts).                       Wash face,  Genitals (private parts) with your normal soap.             6.  Wash thoroughly, paying special attention to the area where your surgery  will be performed.  7.  Thoroughly rinse your body with  warm water from the neck down.  8.  DO NOT shower/wash with your normal soap after using and rinsing off  the CHG Soap.                9.  Pat yourself dry with a clean towel.            10.  Wear clean pajamas.            11.  Place clean sheets on your bed the night of your first shower and do not  sleep with pets. Day of Surgery : Do not apply any lotions/deodorants the morning of surgery.  Please wear clean clothes to the hospital/surgery center.  FAILURE TO FOLLOW THESE INSTRUCTIONS MAY RESULT IN THE CANCELLATION OF YOUR SURGERY PATIENT SIGNATURE_________________________________  NURSE SIGNATURE__________________________________  ________________________________________________________________________

## 2021-01-26 ENCOUNTER — Encounter (HOSPITAL_COMMUNITY): Payer: Self-pay

## 2021-01-26 ENCOUNTER — Encounter (HOSPITAL_COMMUNITY)
Admission: RE | Admit: 2021-01-26 | Discharge: 2021-01-26 | Disposition: A | Discharge status: Home / Self Care | Payer: PPO | Source: Ambulatory Visit | Attending: Specialist | Admitting: Specialist

## 2021-01-26 ENCOUNTER — Other Ambulatory Visit: Payer: Self-pay

## 2021-01-26 DIAGNOSIS — Z01818 Encounter for other preprocedural examination: Secondary | ICD-10-CM | POA: Insufficient documentation

## 2021-01-26 HISTORY — DX: Essential (primary) hypertension: I10

## 2021-01-26 HISTORY — DX: Personal history of urinary calculi: Z87.442

## 2021-01-26 HISTORY — DX: Sleep apnea, unspecified: G47.30

## 2021-01-26 LAB — CBC
HCT: 54.5 % — ABNORMAL HIGH (ref 39.0–52.0)
Hemoglobin: 18.2 g/dL — ABNORMAL HIGH (ref 13.0–17.0)
MCH: 33 pg (ref 26.0–34.0)
MCHC: 33.4 g/dL (ref 30.0–36.0)
MCV: 98.9 fL (ref 80.0–100.0)
Platelets: 221 10*3/uL (ref 150–400)
RBC: 5.51 MIL/uL (ref 4.22–5.81)
RDW: 13.6 % (ref 11.5–15.5)
WBC: 10.4 10*3/uL (ref 4.0–10.5)
nRBC: 0 % (ref 0.0–0.2)

## 2021-01-26 LAB — COMPREHENSIVE METABOLIC PANEL
ALT: 43 U/L (ref 0–44)
AST: 51 U/L — ABNORMAL HIGH (ref 15–41)
Albumin: 4.4 g/dL (ref 3.5–5.0)
Alkaline Phosphatase: 71 U/L (ref 38–126)
Anion gap: 12 (ref 5–15)
BUN: 10 mg/dL (ref 8–23)
CO2: 27 mmol/L (ref 22–32)
Calcium: 9.6 mg/dL (ref 8.9–10.3)
Chloride: 99 mmol/L (ref 98–111)
Creatinine, Ser: 0.83 mg/dL (ref 0.61–1.24)
GFR, Estimated: >60 mL/min (ref >60)
Glucose, Bld: 96 mg/dL (ref 70–99)
Potassium: 4 mmol/L (ref 3.5–5.1)
Sodium: 138 mmol/L (ref 135–145)
Total Bilirubin: 1.1 mg/dL (ref 0.3–1.2)
Total Protein: 7.4 g/dL (ref 6.5–8.1)

## 2021-01-26 NOTE — Progress Notes (Addendum)
Anesthesia Review:  PCP: DR christopher Street LOV 11/29/20 LVMM to Medical Records and requested LOV note. LOV 11/29/20 on chart  Cardiologist : none  Chest x-ray : EKG : 01/26/21  Echo : Stress test: Cardiac Cath :  Activity level: can do a flight of stairs without difficulty  Sleep Study/ CPAP : has cpap  Fasting Blood Sugar :      / Checks Blood Sugar -- times a day:   Blood Thinner/ Instructions /Last Dose: ASA / Instructions/ Last Dose :   Ambulatory- no covid  Smoker Cbc done 01/26/21 routed to DR Beane. Jessica Ward, [facility] made aware of hct 18 and labs routed.  No new orders given.

## 2021-02-01 ENCOUNTER — Ambulatory Visit: Payer: Self-pay | Admitting: Orthopedic Surgery

## 2021-02-01 NOTE — H&P (View-Only) (Signed)
Majid Mccravy is an 70 y.o. male.   Chief Complaint: left knee pain HPI: For location, patient reports left and knee. For duration, patient reports 2 months. For quality, patient reports aching and improving. For severity, patient reports moderate. For alleviating factors, patient reports rest and narcotics (hydrocodone). For aggravating factors, patient reports weightbearing. For previous surgery, patient reports none. For prior imaging, patient reports x ray. For previous injections, patient reports helped temporarily. For previous pt, patient reports none. For work related, patient reports no.  Past Medical History:  Diagnosis Date   History of kidney stones    Hypertension    Sleep apnea     Past Surgical History:  Procedure Laterality Date   cyst removd right ear      left knee scope      seed implant to knee       No family history on file. Social History:  reports that he has been smoking cigarettes. He has a 35.00 pack-year smoking history. He has never used smokeless tobacco. He reports current alcohol use. He reports that he does not use drugs.  Allergies: No Known Allergies  (Not in a hospital admission)   No results found for this or any previous visit (from the past 48 hour(s)). No results found.  Review of Systems  Constitutional: Negative.   HENT: Negative.    Eyes: Negative.   Respiratory: Negative.    Cardiovascular: Negative.   Gastrointestinal: Negative.   Endocrine: Negative.   Genitourinary: Negative.   Musculoskeletal:  Positive for arthralgias, gait problem and joint swelling.  Skin: Negative.    There were no vitals taken for this visit. Physical Exam Constitutional:      Appearance: Normal appearance.  HENT:     Head: Normocephalic.     Right Ear: External ear normal.     Left Ear: External ear normal.     Nose: Nose normal.     Mouth/Throat:     Pharynx: Oropharynx is clear.  Eyes:     Conjunctiva/sclera: Conjunctivae normal.   Cardiovascular:     Rate and Rhythm: Normal rate and regular rhythm.     Pulses: Normal pulses.  Pulmonary:     Effort: Pulmonary effort is normal.     Breath sounds: Normal breath sounds.  Abdominal:     General: Bowel sounds are normal.  Musculoskeletal:     Cervical back: Normal range of motion.     Comments: Examination left knee is tender medial joint line. Equivocal McMurray. He has pain in the lateral gutter. And a loose body palpable in the subcutaneous tissue. Ranges -3-1 20. Ipsilateral hip and ankle exam is unremarkable  Neurological:     Mental Status: He is alert.    MRI of the knee demonstrates tricompartmental arthrosis. Subchondral fractures of the weightbearing aspect of the medial femoral condyle and the medial tibial plateau. Extensive bony edema. Moderate synovitis and large loose body 2.7 cm in length. Multiple and suprapatellar pouch. This independently reviewed by myself.  Assessment/Plan Impression:  Patient with healing subchondral fractures of the medial femoral condyle medial tibial plateau. With a medial meniscus tear multiple loose bodies with a large loose body in the lateral recess  Plan:  Given his medial pain is improved significantly continue to allow healing for that. Activity as tolerated without pain. No running jumping. By cycling okay.  He would like to have the largest of the loose bodies removed and that has been painful for some time. We discussed knee arthroscopy  partial meniscectomy and loose body removal.  Ultimately we discussed the probability of requiring a total knee replacement.  There was medial pain at this point is minimal.  I discussed the risk and benefits of knee arthroscopy including no changes in their symptoms worsening in their symptoms DVT, PE, anesthetic complications etc. Surgical possibilities include chondroplasty, microfracture, partial meniscectomy, plica excision etc. We also discussed the possible need for repeat  arthroscopy in the future as well as possible continued treatment including corticosteroid injections and possible Visco supplementation. In addition we discussed the possibility of even eventually required a total knee replacement if significant arthritis encountered. Also indicating that it is an outpatient procedure. 1-2 days on crutches. Postoperative DVT prophylaxis with aspirin if tolerated. Follow-up in the office in 2 weeks following the surgery. Possible consideration of formal of supervised physical therapy as well.  Plan left knee scope, partial medial meniscectomy, removal of loose body by separate incision  Cecilie Kicks, PA-C for Dr Tonita Cong 02/01/2021, 5:00 PM

## 2021-02-01 NOTE — H&P (Signed)
Troy Simpson is an 70 y.o. male.   Chief Complaint: left knee pain HPI: For location, patient reports left and knee. For duration, patient reports 2 months. For quality, patient reports aching and improving. For severity, patient reports moderate. For alleviating factors, patient reports rest and narcotics (hydrocodone). For aggravating factors, patient reports weightbearing. For previous surgery, patient reports none. For prior imaging, patient reports x ray. For previous injections, patient reports helped temporarily. For previous pt, patient reports none. For work related, patient reports no.  Past Medical History:  Diagnosis Date   History of kidney stones    Hypertension    Sleep apnea     Past Surgical History:  Procedure Laterality Date   cyst removd right ear      left knee scope      seed implant to knee       No family history on file. Social History:  reports that he has been smoking cigarettes. He has a 35.00 pack-year smoking history. He has never used smokeless tobacco. He reports current alcohol use. He reports that he does not use drugs.  Allergies: No Known Allergies  (Not in a hospital admission)   No results found for this or any previous visit (from the past 48 hour(s)). No results found.  Review of Systems  Constitutional: Negative.   HENT: Negative.    Eyes: Negative.   Respiratory: Negative.    Cardiovascular: Negative.   Gastrointestinal: Negative.   Endocrine: Negative.   Genitourinary: Negative.   Musculoskeletal:  Positive for arthralgias, gait problem and joint swelling.  Skin: Negative.    There were no vitals taken for this visit. Physical Exam Constitutional:      Appearance: Normal appearance.  HENT:     Head: Normocephalic.     Right Ear: External ear normal.     Left Ear: External ear normal.     Nose: Nose normal.     Mouth/Throat:     Pharynx: Oropharynx is clear.  Eyes:     Conjunctiva/sclera: Conjunctivae normal.   Cardiovascular:     Rate and Rhythm: Normal rate and regular rhythm.     Pulses: Normal pulses.  Pulmonary:     Effort: Pulmonary effort is normal.     Breath sounds: Normal breath sounds.  Abdominal:     General: Bowel sounds are normal.  Musculoskeletal:     Cervical back: Normal range of motion.     Comments: Examination left knee is tender medial joint line. Equivocal McMurray. He has pain in the lateral gutter. And a loose body palpable in the subcutaneous tissue. Ranges -3-1 20. Ipsilateral hip and ankle exam is unremarkable  Neurological:     Mental Status: He is alert.    MRI of the knee demonstrates tricompartmental arthrosis. Subchondral fractures of the weightbearing aspect of the medial femoral condyle and the medial tibial plateau. Extensive bony edema. Moderate synovitis and large loose body 2.7 cm in length. Multiple and suprapatellar pouch. This independently reviewed by myself.  Assessment/Plan Impression:  Patient with healing subchondral fractures of the medial femoral condyle medial tibial plateau. With a medial meniscus tear multiple loose bodies with a large loose body in the lateral recess  Plan:  Given his medial pain is improved significantly continue to allow healing for that. Activity as tolerated without pain. No running jumping. By cycling okay.  He would like to have the largest of the loose bodies removed and that has been painful for some time. We discussed knee arthroscopy  partial meniscectomy and loose body removal.  Ultimately we discussed the probability of requiring a total knee replacement.  There was medial pain at this point is minimal.  I discussed the risk and benefits of knee arthroscopy including no changes in their symptoms worsening in their symptoms DVT, PE, anesthetic complications etc. Surgical possibilities include chondroplasty, microfracture, partial meniscectomy, plica excision etc. We also discussed the possible need for repeat  arthroscopy in the future as well as possible continued treatment including corticosteroid injections and possible Visco supplementation. In addition we discussed the possibility of even eventually required a total knee replacement if significant arthritis encountered. Also indicating that it is an outpatient procedure. 1-2 days on crutches. Postoperative DVT prophylaxis with aspirin if tolerated. Follow-up in the office in 2 weeks following the surgery. Possible consideration of formal of supervised physical therapy as well.  Plan left knee scope, partial medial meniscectomy, removal of loose body by separate incision  Cecilie Kicks, PA-C for Dr Tonita Cong 02/01/2021, 5:00 PM

## 2021-02-02 ENCOUNTER — Ambulatory Visit (HOSPITAL_COMMUNITY): Payer: PPO | Admitting: Certified Registered Nurse Anesthetist

## 2021-02-02 ENCOUNTER — Ambulatory Visit (HOSPITAL_COMMUNITY): Payer: PPO | Admitting: Physician Assistant

## 2021-02-02 ENCOUNTER — Other Ambulatory Visit: Payer: Self-pay

## 2021-02-02 ENCOUNTER — Encounter (HOSPITAL_COMMUNITY)
Admission: RE | Disposition: A | Discharge status: Home / Self Care | Payer: Self-pay | Source: Ambulatory Visit | Attending: Specialist

## 2021-02-02 ENCOUNTER — Encounter (HOSPITAL_COMMUNITY): Payer: Self-pay | Admitting: Specialist

## 2021-02-02 ENCOUNTER — Ambulatory Visit (HOSPITAL_COMMUNITY)
Admission: RE | Admit: 2021-02-02 | Discharge: 2021-02-02 | Disposition: A | Discharge status: Home / Self Care | Payer: PPO | Source: Ambulatory Visit | Attending: Specialist | Admitting: Specialist

## 2021-02-02 DIAGNOSIS — S80252A Superficial foreign body, left knee, initial encounter: Secondary | ICD-10-CM | POA: Diagnosis not present

## 2021-02-02 DIAGNOSIS — S83282A Other tear of lateral meniscus, current injury, left knee, initial encounter: Secondary | ICD-10-CM | POA: Diagnosis not present

## 2021-02-02 DIAGNOSIS — S83242A Other tear of medial meniscus, current injury, left knee, initial encounter: Secondary | ICD-10-CM

## 2021-02-02 DIAGNOSIS — M23201 Derangement of unspecified lateral meniscus due to old tear or injury, left knee: Secondary | ICD-10-CM | POA: Diagnosis not present

## 2021-02-02 DIAGNOSIS — I10 Essential (primary) hypertension: Secondary | ICD-10-CM | POA: Diagnosis not present

## 2021-02-02 DIAGNOSIS — M109 Gout, unspecified: Secondary | ICD-10-CM | POA: Diagnosis not present

## 2021-02-02 DIAGNOSIS — F17210 Nicotine dependence, cigarettes, uncomplicated: Secondary | ICD-10-CM | POA: Insufficient documentation

## 2021-02-02 DIAGNOSIS — S83232A Complex tear of medial meniscus, current injury, left knee, initial encounter: Secondary | ICD-10-CM | POA: Diagnosis not present

## 2021-02-02 DIAGNOSIS — M1712 Unilateral primary osteoarthritis, left knee: Secondary | ICD-10-CM | POA: Insufficient documentation

## 2021-02-02 DIAGNOSIS — Y939 Activity, unspecified: Secondary | ICD-10-CM | POA: Insufficient documentation

## 2021-02-02 DIAGNOSIS — M2342 Loose body in knee, left knee: Secondary | ICD-10-CM | POA: Insufficient documentation

## 2021-02-02 DIAGNOSIS — X58XXXA Exposure to other specified factors, initial encounter: Secondary | ICD-10-CM | POA: Diagnosis not present

## 2021-02-02 HISTORY — PX: KNEE ARTHROSCOPY WITH MEDIAL MENISECTOMY: SHX5651

## 2021-02-02 SURGERY — ARTHROSCOPY, KNEE, WITH MEDIAL MENISCECTOMY
Anesthesia: General | Site: Knee | Laterality: Left

## 2021-02-02 MED ORDER — FENTANYL CITRATE PF 50 MCG/ML IJ SOSY
50.0000 ug | PREFILLED_SYRINGE | INTRAMUSCULAR | Status: DC
Start: 2021-02-02 — End: 2021-02-02
  Filled 2021-02-02: qty 2

## 2021-02-02 MED ORDER — DOCUSATE SODIUM 100 MG PO CAPS: 100.0000 mg | ORAL_CAPSULE | Freq: Two times a day (BID) | ORAL | 1 refills | Status: AC | PRN

## 2021-02-02 MED ORDER — FENTANYL CITRATE (PF) 100 MCG/2ML IJ SOLN
INTRAMUSCULAR | Status: AC
  Filled 2021-02-02: qty 2

## 2021-02-02 MED ORDER — PROPOFOL 10 MG/ML IV BOLUS
INTRAVENOUS | Status: AC
  Filled 2021-02-02: qty 20

## 2021-02-02 MED ORDER — LIDOCAINE 2% (20 MG/ML) 5 ML SYRINGE
INTRAMUSCULAR | Status: DC | PRN
  Administered 2021-02-02: 100 mg via INTRAVENOUS

## 2021-02-02 MED ORDER — FENTANYL CITRATE (PF) 100 MCG/2ML IJ SOLN
INTRAMUSCULAR | Status: DC | PRN
  Administered 2021-02-02 (×6): 50 ug via INTRAVENOUS

## 2021-02-02 MED ORDER — PHENYLEPHRINE 40 MCG/ML (10ML) SYRINGE FOR IV PUSH (FOR BLOOD PRESSURE SUPPORT)
PREFILLED_SYRINGE | INTRAVENOUS | Status: AC
  Filled 2021-02-02: qty 10

## 2021-02-02 MED ORDER — LACTATED RINGERS IV SOLN: INTRAVENOUS | Status: DC

## 2021-02-02 MED ORDER — ACETAMINOPHEN 10 MG/ML IV SOLN: 1000.0000 mg | Freq: Once | INTRAVENOUS | Status: DC | PRN

## 2021-02-02 MED ORDER — DEXAMETHASONE SODIUM PHOSPHATE 10 MG/ML IJ SOLN
INTRAMUSCULAR | Status: DC | PRN
  Administered 2021-02-02: 10 mg via INTRAVENOUS

## 2021-02-02 MED ORDER — ONDANSETRON HCL 4 MG/2ML IJ SOLN
INTRAMUSCULAR | Status: DC | PRN
Start: 2021-02-02 — End: 2021-02-02
  Administered 2021-02-02: 4 mg via INTRAVENOUS

## 2021-02-02 MED ORDER — LIDOCAINE HCL (PF) 2 % IJ SOLN
INTRAMUSCULAR | Status: AC
  Filled 2021-02-02: qty 5

## 2021-02-02 MED ORDER — EPINEPHRINE PF 1 MG/ML IJ SOLN
INTRAMUSCULAR | Status: DC | PRN
  Administered 2021-02-02: 2 mL

## 2021-02-02 MED ORDER — PROPOFOL 10 MG/ML IV BOLUS
INTRAVENOUS | Status: DC | PRN
  Administered 2021-02-02: 250 mg via INTRAVENOUS

## 2021-02-02 MED ORDER — BUPIVACAINE-EPINEPHRINE (PF) 0.5% -1:200000 IJ SOLN
INTRAMUSCULAR | Status: AC
  Filled 2021-02-02: qty 30

## 2021-02-02 MED ORDER — ONDANSETRON HCL 4 MG/2ML IJ SOLN
INTRAMUSCULAR | Status: AC
  Filled 2021-02-02: qty 2

## 2021-02-02 MED ORDER — EPINEPHRINE PF 1 MG/ML IJ SOLN
INTRAMUSCULAR | Status: AC
  Filled 2021-02-02: qty 2

## 2021-02-02 MED ORDER — PHENYLEPHRINE 40 MCG/ML (10ML) SYRINGE FOR IV PUSH (FOR BLOOD PRESSURE SUPPORT)
PREFILLED_SYRINGE | INTRAVENOUS | Status: DC | PRN
  Administered 2021-02-02 (×2): 80 ug via INTRAVENOUS

## 2021-02-02 MED ORDER — OXYCODONE HCL 5 MG PO TABS: 5.0000 mg | ORAL_TABLET | ORAL | 0 refills | Status: AC | PRN

## 2021-02-02 MED ORDER — DEXAMETHASONE SODIUM PHOSPHATE 10 MG/ML IJ SOLN
INTRAMUSCULAR | Status: AC
  Filled 2021-02-02: qty 1

## 2021-02-02 MED ORDER — MIDAZOLAM HCL 2 MG/2ML IJ SOLN
1.0000 mg | INTRAMUSCULAR | Status: DC
  Filled 2021-02-02: qty 2

## 2021-02-02 MED ORDER — ASPIRIN EC 81 MG PO TBEC: 81.0000 mg | DELAYED_RELEASE_TABLET | Freq: Two times a day (BID) | ORAL | 1 refills | Status: AC

## 2021-02-02 MED ORDER — CEFAZOLIN SODIUM-DEXTROSE 2-4 GM/100ML-% IV SOLN
2.0000 g | INTRAVENOUS | Status: AC
  Administered 2021-02-02: 2 g via INTRAVENOUS
  Filled 2021-02-02: qty 100

## 2021-02-02 MED ORDER — FENTANYL CITRATE PF 50 MCG/ML IJ SOSY: 25.0000 ug | PREFILLED_SYRINGE | INTRAMUSCULAR | Status: DC | PRN

## 2021-02-02 MED ORDER — BUPIVACAINE-EPINEPHRINE 0.5% -1:200000 IJ SOLN
INTRAMUSCULAR | Status: DC | PRN
  Administered 2021-02-02: 30 mL

## 2021-02-02 MED ORDER — CHLORHEXIDINE GLUCONATE 0.12 % MT SOLN
15.0000 mL | Freq: Once | OROMUCOSAL | Status: AC
  Administered 2021-02-02: 15 mL via OROMUCOSAL

## 2021-02-02 MED ORDER — SODIUM CHLORIDE 0.9 % IR SOLN
Status: DC | PRN
  Administered 2021-02-02 (×2): 3000 mL

## 2021-02-02 SURGICAL SUPPLY — 34 items
BAG COUNTER SPONGE SURGICOUNT (BAG) ×2 IMPLANT
BAG SPNG CNTER NS LX DISP (BAG) ×1
BLADE SHAVER TORPEDO 4X13 (MISCELLANEOUS) ×1 IMPLANT
BLADE SURG 15 STRL LF DISP TIS (BLADE) IMPLANT
BLADE SURG 15 STRL SS (BLADE) ×2
BNDG ELASTIC 6X5.8 VLCR STR LF (GAUZE/BANDAGES/DRESSINGS) ×2 IMPLANT
BOOTIES KNEE HIGH SLOAN (MISCELLANEOUS) ×4 IMPLANT
CLSR STERI-STRIP ANTIMIC 1/2X4 (GAUZE/BANDAGES/DRESSINGS) ×1 IMPLANT
COVER SURGICAL LIGHT HANDLE (MISCELLANEOUS) ×2 IMPLANT
DRSG PAD ABDOMINAL 8X10 ST (GAUZE/BANDAGES/DRESSINGS) ×2 IMPLANT
DURAPREP 26ML APPLICATOR (WOUND CARE) ×2 IMPLANT
ELECT PENCIL ROCKER SW 15FT (MISCELLANEOUS) ×1 IMPLANT
ELECT REM PT RETURN 15FT ADLT (MISCELLANEOUS) ×1 IMPLANT
GAUZE SPONGE 4X4 12PLY STRL (GAUZE/BANDAGES/DRESSINGS) ×2 IMPLANT
GLOVE SURG POLYISO LF SZ7.5 (GLOVE) ×2 IMPLANT
GLOVE SURG POLYISO LF SZ8 (GLOVE) ×2 IMPLANT
GLOVE SURG UNDER POLY LF SZ7.5 (GLOVE) ×2 IMPLANT
GOWN STRL REUS W/TWL XL LVL3 (GOWN DISPOSABLE) ×4 IMPLANT
KIT BASIN OR (CUSTOM PROCEDURE TRAY) ×1 IMPLANT
KIT TURNOVER KIT A (KITS) ×2 IMPLANT
MANIFOLD NEPTUNE II (INSTRUMENTS) ×2 IMPLANT
PACK ARTHROSCOPY WL (CUSTOM PROCEDURE TRAY) ×2 IMPLANT
PADDING CAST COTTON 6X4 STRL (CAST SUPPLIES) ×2 IMPLANT
PENCIL SMOKE EVACUATOR (MISCELLANEOUS) IMPLANT
SUCTION FRAZIER HANDLE 12FR (TUBING) ×2
SUCTION TUBE FRAZIER 12FR DISP (TUBING) IMPLANT
SUT ETHILON 4 0 PS 2 18 (SUTURE) ×2 IMPLANT
SUT VIC AB 2-0 CT1 27 (SUTURE) ×2
SUT VIC AB 2-0 CT1 TAPERPNT 27 (SUTURE) IMPLANT
TOWEL OR 17X26 10 PK STRL BLUE (TOWEL DISPOSABLE) ×2 IMPLANT
TUBING ARTHROSCOPY IRRIG 16FT (MISCELLANEOUS) ×2 IMPLANT
WAND APOLLORF SJ50 AR-9845 (SURGICAL WAND) IMPLANT
WIPE CHG CHLORHEXIDINE 2% (PERSONAL CARE ITEMS) ×2 IMPLANT
WRAP KNEE MAXI GEL POST OP (GAUZE/BANDAGES/DRESSINGS) ×2 IMPLANT

## 2021-02-02 NOTE — Interval H&P Note (Signed)
History and Physical Interval Note:  02/02/2021 12:33 PM  Troy Simpson  has presented today for surgery, with the diagnosis of Left knee medial meniscus tear, degenerative joint disease, loose body.  The various methods of treatment have been discussed with the patient and family. After consideration of risks, benefits and other options for treatment, the patient has consented to  Procedure(s): KNEE ARTHROSCOPY WITH PARTIAL MEDIAL MENISECTOMY, REMOVAL LOOSE BODY BY SEPERATE INCISION (Left) as a surgical intervention.  The patient's history has been reviewed, patient examined, no change in status, stable for surgery.  I have reviewed the patient's chart and labs.  Questions were answered to the patient's satisfaction.     Johnn Hai

## 2021-02-02 NOTE — Anesthesia Preprocedure Evaluation (Signed)
Anesthesia Evaluation  Patient identified by MRN, date of birth, ID band Patient awake    Reviewed: Allergy & Precautions, NPO status , Patient's Chart, lab work & pertinent test results  Airway Mallampati: II  TM Distance: >3 FB Neck ROM: Full    Dental  (+) Teeth Intact   Pulmonary sleep apnea , Current Smoker,    Pulmonary exam normal        Cardiovascular hypertension, Pt. on medications  Rhythm:Regular Rate:Normal     Neuro/Psych negative neurological ROS  negative psych ROS   GI/Hepatic negative GI ROS, Neg liver ROS,   Endo/Other  negative endocrine ROS  Renal/GU negative Renal ROS  negative genitourinary   Musculoskeletal Left meniscal tear   Abdominal (+)  Abdomen: soft.    Peds  Hematology negative hematology ROS (+)   Anesthesia Other Findings   Reproductive/Obstetrics                             Anesthesia Physical Anesthesia Plan  ASA: 2  Anesthesia Plan: General   Post-op Pain Management:    Induction: Intravenous  PONV Risk Score and Plan: 1 and Ondansetron, Dexamethasone and Treatment may vary due to age or medical condition  Airway Management Planned: Mask and LMA  Additional Equipment: None  Intra-op Plan:   Post-operative Plan: Extubation in OR  Informed Consent: I have reviewed the patients History and Physical, chart, labs and discussed the procedure including the risks, benefits and alternatives for the proposed anesthesia with the patient or authorized representative who has indicated his/her understanding and acceptance.     Dental advisory given  Plan Discussed with: CRNA  Anesthesia Plan Comments:         Anesthesia Quick Evaluation

## 2021-02-02 NOTE — Discharge Instructions (Signed)

## 2021-02-02 NOTE — Anesthesia Procedure Notes (Signed)
Procedure Name: LMA Insertion Date/Time: 02/02/2021 12:53 PM Performed by: Genelle Bal, CRNA Pre-anesthesia Checklist: Patient identified, Emergency Drugs available, Suction available and Patient being monitored Patient Re-evaluated:Patient Re-evaluated prior to induction Oxygen Delivery Method: Circle system utilized Preoxygenation: Pre-oxygenation with 100% oxygen Induction Type: IV induction Ventilation: Mask ventilation without difficulty LMA: LMA inserted LMA Size: 4.0 Number of attempts: 1 Airway Equipment and Method: Bite block Placement Confirmation: positive ETCO2 Tube secured with: Tape Dental Injury: Teeth and Oropharynx as per pre-operative assessment

## 2021-02-02 NOTE — Transfer of Care (Signed)
Immediate Anesthesia Transfer of Care Note  Patient: Troy Simpson  Procedure(s) Performed: KNEE ARTHROSCOPY WITH PARTIAL MEDIAL MENISECTOMY, REMOVAL LOOSE BODY BY SEPERATE INCISION (Left: Knee)  Patient Location: PACU  Anesthesia Type:General  Level of Consciousness: awake, alert  and oriented  Airway & Oxygen Therapy: Patient Spontanous Breathing and Patient connected to face mask oxygen  Post-op Assessment: Report given to RN and Post -op Vital signs reviewed and stable  Post vital signs: Reviewed and stable  Last Vitals:  Vitals Value Taken Time  BP 120/78 02/02/21 1419  Temp    Pulse 86 02/02/21 1420  Resp 14 02/02/21 1420  SpO2 99 % 02/02/21 1420  Vitals shown include unvalidated device data.  Last Pain:  Vitals:   02/02/21 1135  TempSrc: Oral         Complications: No notable events documented.

## 2021-02-02 NOTE — Anesthesia Postprocedure Evaluation (Signed)
Anesthesia Post Note  Patient: Troy Simpson  Procedure(s) Performed: KNEE ARTHROSCOPY WITH PARTIAL MEDIAL MENISECTOMY, REMOVAL LOOSE BODY BY SEPERATE INCISION (Left: Knee)     Patient location during evaluation: PACU Anesthesia Type: General Level of consciousness: awake and alert Pain management: pain level controlled Vital Signs Assessment: post-procedure vital signs reviewed and stable Respiratory status: spontaneous breathing, nonlabored ventilation, respiratory function stable and patient connected to nasal cannula oxygen Cardiovascular status: blood pressure returned to baseline and stable Postop Assessment: no apparent nausea or vomiting Anesthetic complications: no   No notable events documented.  Last Vitals:  Vitals:   02/02/21 1445 02/02/21 1500  BP: 124/72 139/77  Pulse: 91 88  Resp: 18 16  Temp:  36.9 C  SpO2: 91% 93%    Last Pain:  Vitals:   02/02/21 1500  TempSrc:   PainSc: 2                  Roman Dubuc P [place]

## 2021-02-02 NOTE — Brief Op Note (Signed)
02/02/2021  2:04 PM  PATIENT:  Troy Simpson  70 y.o. male  PRE-OPERATIVE DIAGNOSIS:  Left knee medial meniscus tear, degenerative joint disease, loose body  POST-OPERATIVE DIAGNOSIS:  Left knee medial meniscus tear, degenerative joint disease, loose body  PROCEDURE:  Procedure(s): KNEE ARTHROSCOPY WITH PARTIAL MEDIAL MENISECTOMY, REMOVAL LOOSE BODY BY SEPERATE INCISION (Left)  SURGEON:  Surgeon(s) and Role:    Susa Day, MD - Primary  PHYSICIAN ASSISTANT:   ASSISTANTS: [place]   ANESTHESIA:   general  EBL:  50 mL   BLOOD ADMINISTERED:none  DRAINS: none   LOCAL MEDICATIONS USED:  MARCAINE     SPECIMEN:  No Specimen  DISPOSITION OF SPECIMEN:  PATHOLOGY  COUNTS:  YES  TOURNIQUET:  * No tourniquets in log *  DICTATION: .Other Dictation: Dictation Number [id number]  PLAN OF CARE: Discharge to home after PACU  PATIENT DISPOSITION:  PACU - hemodynamically stable.   Delay start of Pharmacological VTE agent (>24hrs) due to surgical blood loss or risk of bleeding: no

## 2021-02-03 ENCOUNTER — Encounter (HOSPITAL_COMMUNITY): Payer: Self-pay | Admitting: Specialist

## 2021-02-03 NOTE — Op Note (Signed)
NAME: MARCELLIUS, Troy Simpson MEDICAL RECORD NO: [id number] ACCOUNT NO: [id number] DATE OF BIRTH: January 20, 1951 FACILITY: [facility] LOCATION: [facility] PHYSICIAN: Johnn Hai, MD  Operative Report   DATE OF PROCEDURE: 02/02/2021  PREOPERATIVE DIAGNOSES:  Degenerative joint disease, medial meniscus tear, loose bodies of the left knee.  POSTOPERATIVE DIAGNOSES:  Degenerative joint disease, medial meniscus tear, loose bodies of the left knee, lateral meniscus tear, gouty arthropathy.  PROCEDURE PERFORMED:   1.  Left knee arthroscopy. 2.  Partial medial and lateral meniscectomy. 3.  Chondroplasty of the medial tibial plateau, medial femoral condyle, lateral femoral condyle, patella, removal of loose body through a separate fascial incision.  ANESTHESIA:  General.  ASSISTANT:  Lacie Draft, PA.  HISTORY: A 70 year old with DJD, locking, popping, giving way of the knee.  He had a large loose body in the superior lateral gutter that was known, generating pain and instability.  He also had medial meniscus tears and gouty arthropathy.  He was  indicated for knee arthroscopy, partial meniscectomies and debridement and removal of loose body through a separate fascial incision. The risks and benefits discussed including bleeding, infection, damage to vascular structures, no change in symptoms,  worsening symptoms, DVT, PE, anesthetic complications, etc.  TECHNIQUE:  The patient in supine position.  After induction of adequate general anesthesia, and 2 grams Kefzol, the left lower extremity was prepped and draped in the usual sterile fashion.  A lateral parapatellar portal was fashioned with a #11 blade.   Ingress cannula atraumatically placed.  Irrigant was utilized to insufflate the joint.  Under direct visualization, a medial parapatellar portal was fashioned with a #11 blade after localization with an 18-gauge needle sparing medial meniscus.  Noted  was extensive grade 3 changes in medial compartment with  tophaceous deposits.  A complex tear of the medial meniscus posterior half, I introduced a shaver and an [place] to resect approximately one-third of the posterior half to a stable base.  This  was stable to probe palpation.  Light chondroplasty was performed of the tibial plateau and femoral condyle.  Suprapatellar pouch revealed some grade 4 changes significant over the femoral sulcus and the patella.  There was normal patellofemoral tracking.  A large suprapatellar loose body measuring 5-7 x 3-4 cm.  Examination of the lateral compartment revealed  degenerative tearing of the lateral meniscus and chondromalacia of the lateral femoral condyle.  Light chondroplasty performed here. Performed a partial medial meniscectomy to a stable base.  This with a shaver.  There were no grade 4 changes in the  lateral compartment.  There were degenerative changes of the ACL, was then partially debrided.  I revisited all compartments.  No further pathology amenable to arthroscopic intervention.  I, therefore, closed all the portals and converted to a mini open  incision to remove the large loose body.  This was performed in the superior lateral region of the knee with an incision made approximately 3 cm in length.  Subcutaneous tissue was dissected with electrocautery utilized to achieve hemostasis.  The joint  capsule was divided in line with skin incision.  A self-retaining retractor was then noted.  The large loose body was noted.  It was then able to be delivered through the incision.  The loose body was then fragmented and these different fragments were  retrieved, 3 fragments measuring approximately 2 x 1.5 cm.  Then, 3 additional loose bodies separate from this approximately 1 x 1 cm.  I copiously lavaged the joint here and I infiltrated with 0.25%  Marcaine with epinephrine.  I then closed the capsule  with 2-0 Vicryl and the skin with nylon.  Sterile dressing applied.  0.25% Marcaine with epinephrine was  infiltrated into the joint.  Wound was dressed sterilely, awoken without difficulty and transported to the recovery room in satisfactory condition.  The patient tolerated the procedure well.  No complications.  Assistant, Lacie Draft, [place].  Minimal blood loss.    PAA D: 02/02/2021 2:10:37 pm T: 02/03/2021 2:06:00 am  JOB: [id number]/ [id number]

## 2021-02-05 LAB — SURGICAL PATHOLOGY

## 2021-02-28 DIAGNOSIS — N200 Calculus of kidney: Secondary | ICD-10-CM | POA: Diagnosis not present

## 2021-02-28 DIAGNOSIS — C61 Malignant neoplasm of prostate: Secondary | ICD-10-CM | POA: Diagnosis not present

## 2022-03-16 IMAGING — MR MR KNEE*L* W/O CM
6 series · 40 of 40 positions shown · non-contrast
Comparison: Radiograph 12/14/2019

CLINICAL DATA: Left knee pain and swelling

EXAM:
MRI OF THE LEFT KNEE WITHOUT CONTRAST
TECHNIQUE: Multiplanar, multisequence MR imaging of the knee was performed. No
intravenous contrast was administered.

[Series 6: T2 fat-sat · axial · left · 4.0mm · 0.50mm/px · z∈[-72,+68]mm · 9 of 33 slices shown (1 of 3)]
[im 1/33]
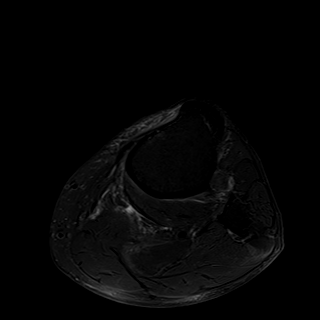
[im 5/33]
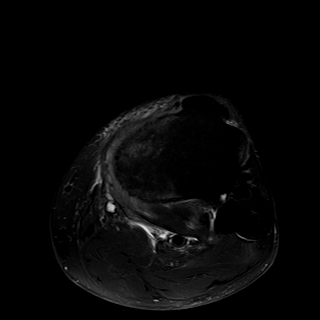
[im 9/33]
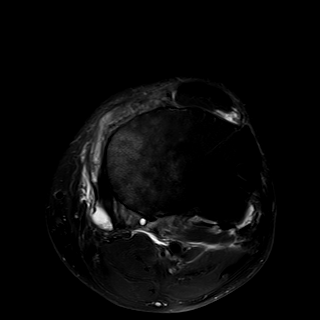
[im 13/33]
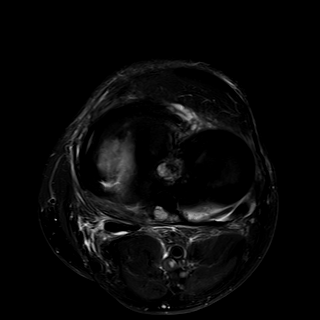
[im 17/33]
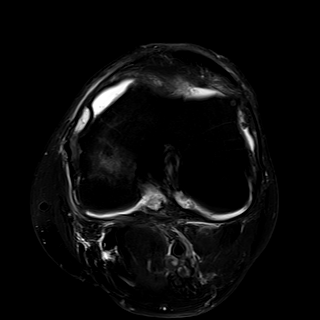
[im 21/33]
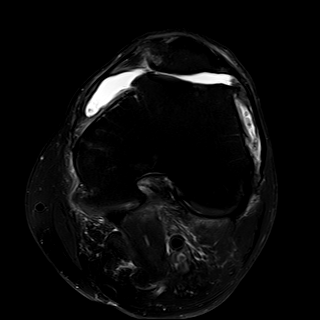
[im 25/33]
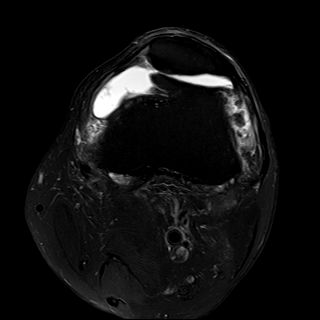
[im 29/33]
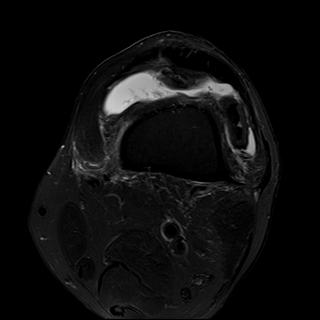
[im 33/33]
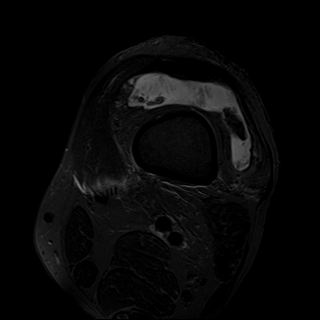

[Series 7: T2 fat-sat · coronal · left · 4.0mm · 0.47mm/px · 7 of 28 slices shown (2 of 3)]
[im 1/28]
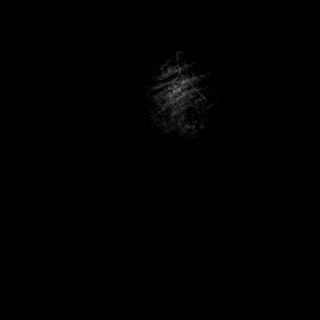
[im 5/28]
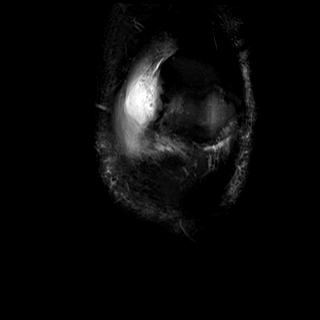
[im 10/28]
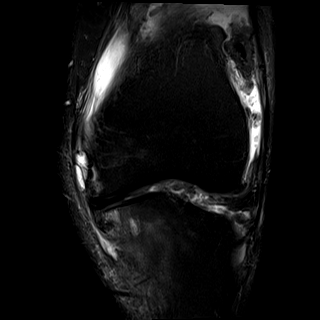
[im 14/28]
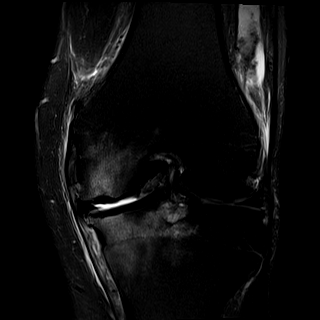
[im 19/28]
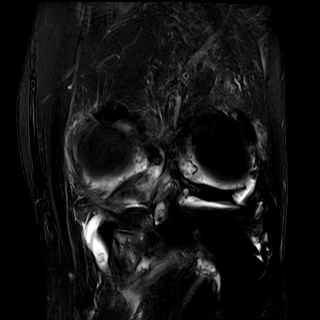
[im 23/28]
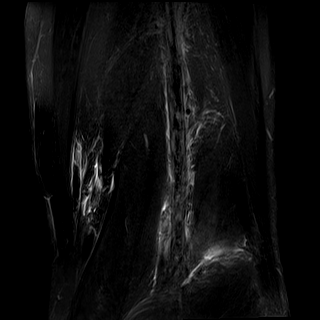
[im 28/28]
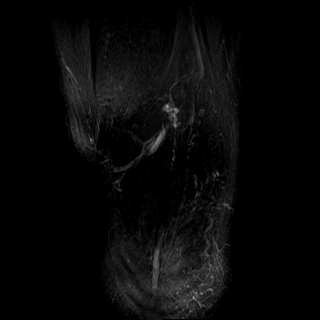

[Series 8: T1 · coronal · left · 4.0mm · 0.47mm/px · 6 of 28 slices shown]
[im 1/28]
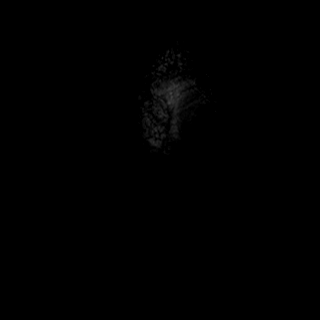
[im 6/28]
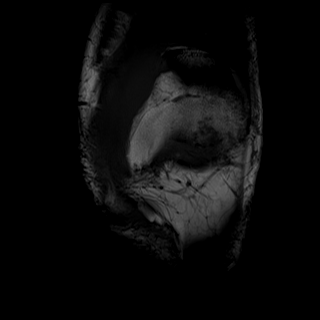
[im 11/28]
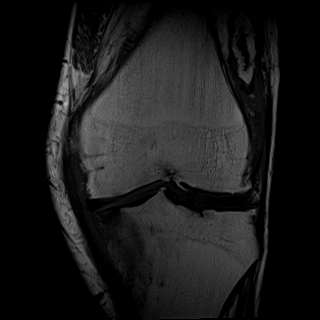
[im 17/28]
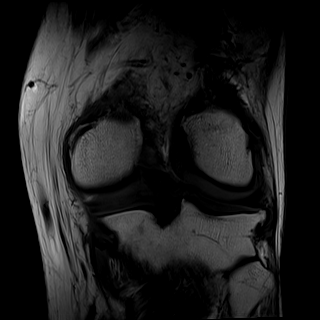
[im 22/28]
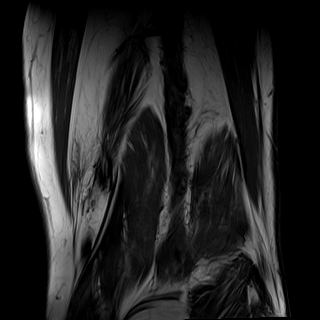
[im 28/28]
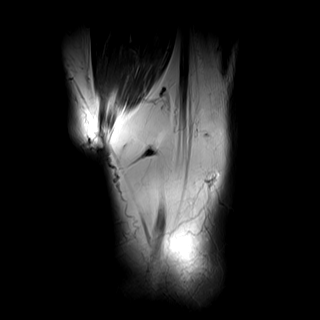

[Series 9: PD fat-sat · coronal · left · 3.0mm · 0.47mm/px · 6 of 28 slices shown (1 of 2)]
[im 1/28]
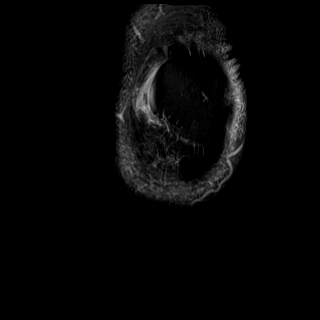
[im 6/28]
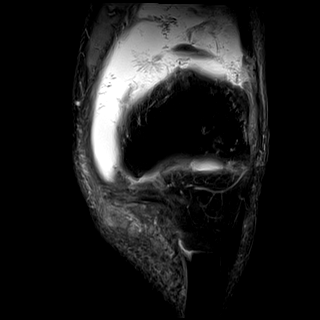
[im 11/28]
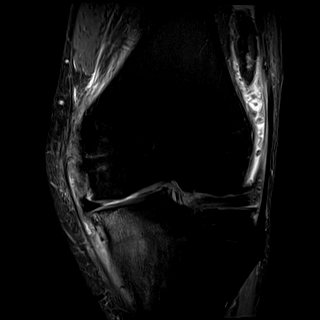
[im 17/28]
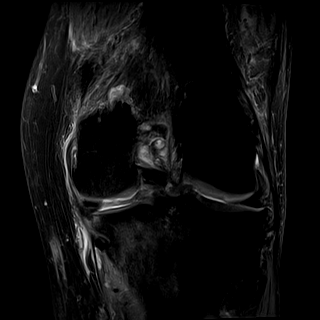
[im 22/28]
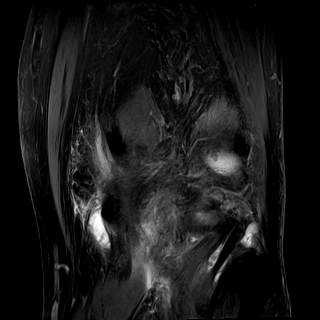
[im 28/28]
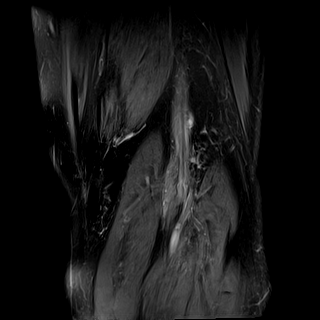

[Series 10: PD fat-sat · sagittal · left · 3.0mm · 0.47mm/px · 6 of 28 slices shown (2 of 2)]
[im 1/28]
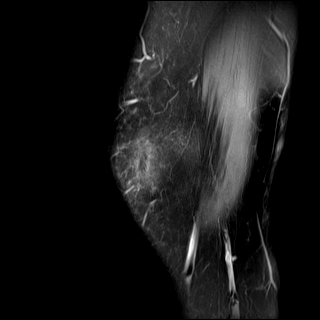
[im 6/28]
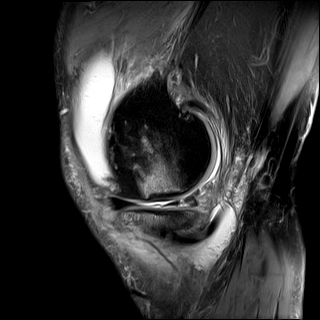
[im 11/28]
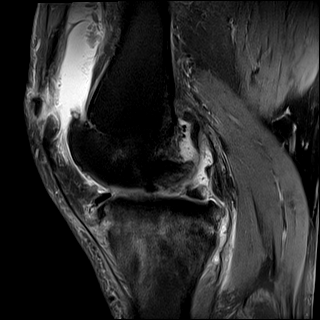
[im 17/28]
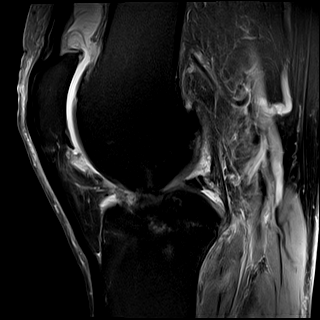
[im 22/28]
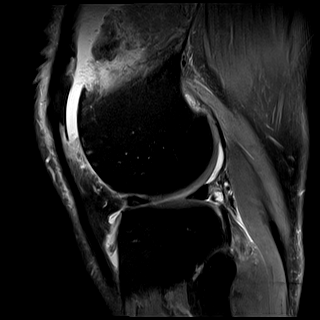
[im 28/28]
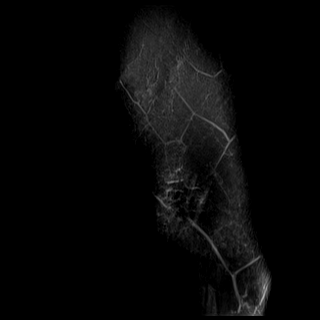

[Series 11: T2 fat-sat · sagittal · left · 3.0mm · 0.47mm/px · 6 of 28 slices shown (3 of 3)]
[im 1/28]
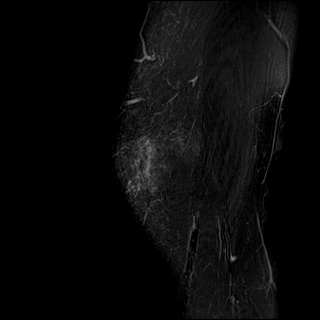
[im 6/28]
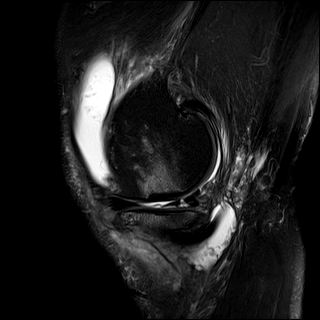
[im 11/28]
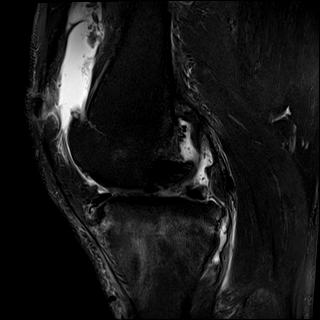
[im 17/28]
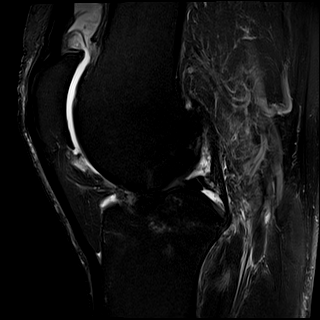
[im 22/28]
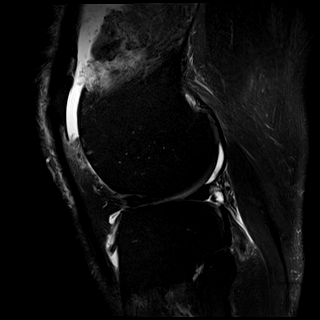
[im 28/28]
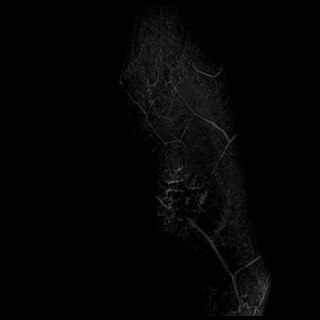

[40 of 40 positions shown; findings below may reference images not displayed]

FINDINGS: MENISCI

Medial: There is complex multi-directional tearing of the posterior
horn medial meniscus with radial and vertical longitudinal
components. Mild meniscal extrusion. Likely horizontal extension
into the meniscal body. There is a meniscal flounce of the body.

Lateral: Intact.

LIGAMENTS

Cruciates: ACL and PCL are intact.

Collaterals: Periligamentous edema along the MCL, likely related to
underlying medial compartment pathology. The MCL is otherwise
intact. Lateral collateral ligament complex is intact.

CARTILAGE

Patellofemoral: Full-thickness cartilage loss along the lateral
patellar facet and lateral femoral condyle.

Medial: Broad area of full-thickness cartilage loss along the
weight-bearing medial femoral condyle measuring approximately 2.0 x
1.8 cm.

Lateral: Mild to moderate chondrosis with areas of low and
intermediate grade fissuring.

JOINT: Moderate-sized joint effusion with synovitis. There are
multiple intra-articular joint bodies most collecting in the
suprapatellar joint space. The largest measures 2.7 cm (axial T2
image 5).

POPLITEAL FOSSA: Small Baker cyst.

EXTENSOR MECHANISM: Intact quadriceps tendon. Intact patellar
tendon.

BONES: There is a small subchondral fracture of the weight-bearing
medial femoral condyle and of the adjacent medial tibial plateau
anteriorly. Extensive associated bony edema. Intraosseous ganglia
formation at the ACL insertion on the tibia. Tricompartment
osteophyte formation.

Other: No fluid collection or hematoma. Muscles are normal.
IMPRESSION: Tricompartment osteoarthritis, severe in the medial and
patellofemoral compartments with full-thickness cartilage loss as
described above.

Small subchondral fractures of the weight-bearing medial femoral
condyle and medial tibial plateau anteriorly. Extensive associated
bony edema.

Complex multi-directional tearing of the posterior horn medial
meniscus with radial and vertical longitudinal components. Likely
horizontal extension into the meniscal body. Mild meniscal
extrusion.

Moderate-sized joint effusion with synovitis. Multiple
intra-articular joint bodies, most collecting in the suprapatellar
joint space, largest measuring up to 2.7 cm.

Small Baker cyst.

## 2023-05-15 DIAGNOSIS — I10 Essential (primary) hypertension: Secondary | ICD-10-CM | POA: Diagnosis not present

## 2023-05-16 DIAGNOSIS — H539 Unspecified visual disturbance: Secondary | ICD-10-CM | POA: Diagnosis not present

## 2023-05-16 DIAGNOSIS — I6523 Occlusion and stenosis of bilateral carotid arteries: Secondary | ICD-10-CM | POA: Diagnosis not present

## 2023-05-21 DIAGNOSIS — I6523 Occlusion and stenosis of bilateral carotid arteries: Secondary | ICD-10-CM | POA: Diagnosis not present

## 2023-05-21 DIAGNOSIS — H539 Unspecified visual disturbance: Secondary | ICD-10-CM | POA: Diagnosis not present

## 2023-06-16 DIAGNOSIS — I6529 Occlusion and stenosis of unspecified carotid artery: Secondary | ICD-10-CM | POA: Diagnosis not present

## 2023-06-16 DIAGNOSIS — I739 Peripheral vascular disease, unspecified: Secondary | ICD-10-CM | POA: Diagnosis not present

## 2023-06-16 DIAGNOSIS — I6523 Occlusion and stenosis of bilateral carotid arteries: Secondary | ICD-10-CM | POA: Diagnosis not present

## 2023-06-16 DIAGNOSIS — Z720 Tobacco use: Secondary | ICD-10-CM | POA: Diagnosis not present

## 2023-06-27 DIAGNOSIS — M1711 Unilateral primary osteoarthritis, right knee: Secondary | ICD-10-CM | POA: Diagnosis not present

## 2023-06-30 DIAGNOSIS — I6521 Occlusion and stenosis of right carotid artery: Secondary | ICD-10-CM | POA: Diagnosis not present

## 2023-06-30 DIAGNOSIS — I6529 Occlusion and stenosis of unspecified carotid artery: Secondary | ICD-10-CM | POA: Diagnosis not present

## 2023-07-04 DIAGNOSIS — M1711 Unilateral primary osteoarthritis, right knee: Secondary | ICD-10-CM | POA: Diagnosis not present

## 2023-07-11 DIAGNOSIS — M1711 Unilateral primary osteoarthritis, right knee: Secondary | ICD-10-CM | POA: Diagnosis not present

## 2023-07-18 DIAGNOSIS — F17210 Nicotine dependence, cigarettes, uncomplicated: Secondary | ICD-10-CM | POA: Diagnosis not present

## 2023-07-18 DIAGNOSIS — I6521 Occlusion and stenosis of right carotid artery: Secondary | ICD-10-CM | POA: Diagnosis not present

## 2023-07-22 DIAGNOSIS — G4733 Obstructive sleep apnea (adult) (pediatric): Secondary | ICD-10-CM | POA: Diagnosis not present

## 2023-07-22 DIAGNOSIS — F17210 Nicotine dependence, cigarettes, uncomplicated: Secondary | ICD-10-CM | POA: Diagnosis not present

## 2023-07-24 DIAGNOSIS — G4733 Obstructive sleep apnea (adult) (pediatric): Secondary | ICD-10-CM | POA: Diagnosis not present

## 2023-07-25 DIAGNOSIS — H539 Unspecified visual disturbance: Secondary | ICD-10-CM | POA: Diagnosis not present

## 2023-08-04 DIAGNOSIS — G4733 Obstructive sleep apnea (adult) (pediatric): Secondary | ICD-10-CM | POA: Diagnosis not present

## 2023-08-12 DIAGNOSIS — Z0389 Encounter for observation for other suspected diseases and conditions ruled out: Secondary | ICD-10-CM | POA: Diagnosis not present

## 2023-08-12 DIAGNOSIS — I498 Other specified cardiac arrhythmias: Secondary | ICD-10-CM | POA: Diagnosis not present

## 2023-08-12 DIAGNOSIS — I10 Essential (primary) hypertension: Secondary | ICD-10-CM | POA: Diagnosis not present

## 2023-08-12 DIAGNOSIS — F17210 Nicotine dependence, cigarettes, uncomplicated: Secondary | ICD-10-CM | POA: Diagnosis not present

## 2023-08-12 DIAGNOSIS — Z8546 Personal history of malignant neoplasm of prostate: Secondary | ICD-10-CM | POA: Diagnosis not present

## 2023-08-12 DIAGNOSIS — I6521 Occlusion and stenosis of right carotid artery: Secondary | ICD-10-CM | POA: Diagnosis not present

## 2023-08-12 DIAGNOSIS — I959 Hypotension, unspecified: Secondary | ICD-10-CM | POA: Diagnosis not present

## 2023-08-12 DIAGNOSIS — Z136 Encounter for screening for cardiovascular disorders: Secondary | ICD-10-CM | POA: Diagnosis not present

## 2023-08-12 DIAGNOSIS — Z7982 Long term (current) use of aspirin: Secondary | ICD-10-CM | POA: Diagnosis not present

## 2023-08-21 DIAGNOSIS — R799 Abnormal finding of blood chemistry, unspecified: Secondary | ICD-10-CM | POA: Diagnosis not present

## 2023-08-21 DIAGNOSIS — D72829 Elevated white blood cell count, unspecified: Secondary | ICD-10-CM | POA: Diagnosis not present

## 2023-09-03 DIAGNOSIS — G4733 Obstructive sleep apnea (adult) (pediatric): Secondary | ICD-10-CM | POA: Diagnosis not present

## 2023-09-05 DIAGNOSIS — I6521 Occlusion and stenosis of right carotid artery: Secondary | ICD-10-CM | POA: Diagnosis not present

## 2023-09-05 DIAGNOSIS — Z9889 Other specified postprocedural states: Secondary | ICD-10-CM | POA: Diagnosis not present

## 2023-09-05 DIAGNOSIS — E785 Hyperlipidemia, unspecified: Secondary | ICD-10-CM | POA: Diagnosis not present

## 2023-09-05 DIAGNOSIS — E663 Overweight: Secondary | ICD-10-CM | POA: Diagnosis not present

## 2023-09-05 DIAGNOSIS — I10 Essential (primary) hypertension: Secondary | ICD-10-CM | POA: Diagnosis not present

## 2023-09-05 DIAGNOSIS — Z6827 Body mass index (BMI) 27.0-27.9, adult: Secondary | ICD-10-CM | POA: Diagnosis not present

## 2023-09-05 DIAGNOSIS — I672 Cerebral atherosclerosis: Secondary | ICD-10-CM | POA: Diagnosis not present

## 2023-09-10 DIAGNOSIS — Z9889 Other specified postprocedural states: Secondary | ICD-10-CM | POA: Diagnosis not present

## 2023-09-10 DIAGNOSIS — E785 Hyperlipidemia, unspecified: Secondary | ICD-10-CM | POA: Diagnosis not present

## 2023-09-10 DIAGNOSIS — I10 Essential (primary) hypertension: Secondary | ICD-10-CM | POA: Diagnosis not present

## 2023-09-10 DIAGNOSIS — I6522 Occlusion and stenosis of left carotid artery: Secondary | ICD-10-CM | POA: Diagnosis not present

## 2023-09-25 DIAGNOSIS — F17210 Nicotine dependence, cigarettes, uncomplicated: Secondary | ICD-10-CM | POA: Diagnosis not present

## 2023-09-25 DIAGNOSIS — G4733 Obstructive sleep apnea (adult) (pediatric): Secondary | ICD-10-CM | POA: Diagnosis not present

## 2023-10-15 DIAGNOSIS — H25813 Combined forms of age-related cataract, bilateral: Secondary | ICD-10-CM | POA: Diagnosis not present

## 2023-10-15 DIAGNOSIS — H52223 Regular astigmatism, bilateral: Secondary | ICD-10-CM | POA: Diagnosis not present

## 2023-10-15 DIAGNOSIS — Z01818 Encounter for other preprocedural examination: Secondary | ICD-10-CM | POA: Diagnosis not present

## 2023-10-15 DIAGNOSIS — H25811 Combined forms of age-related cataract, right eye: Secondary | ICD-10-CM | POA: Diagnosis not present

## 2023-10-22 DIAGNOSIS — H25811 Combined forms of age-related cataract, right eye: Secondary | ICD-10-CM | POA: Diagnosis not present

## 2023-10-22 DIAGNOSIS — H52221 Regular astigmatism, right eye: Secondary | ICD-10-CM | POA: Diagnosis not present

## 2023-10-22 DIAGNOSIS — H5703 Miosis: Secondary | ICD-10-CM | POA: Diagnosis not present

## 2023-10-22 DIAGNOSIS — H278 Other specified disorders of lens: Secondary | ICD-10-CM | POA: Diagnosis not present

## 2023-10-22 DIAGNOSIS — H269 Unspecified cataract: Secondary | ICD-10-CM | POA: Diagnosis not present

## 2023-10-22 DIAGNOSIS — H2511 Age-related nuclear cataract, right eye: Secondary | ICD-10-CM | POA: Diagnosis not present

## 2023-11-03 DIAGNOSIS — C61 Malignant neoplasm of prostate: Secondary | ICD-10-CM | POA: Diagnosis not present

## 2023-11-03 DIAGNOSIS — N200 Calculus of kidney: Secondary | ICD-10-CM | POA: Diagnosis not present

## 2023-11-04 DIAGNOSIS — N200 Calculus of kidney: Secondary | ICD-10-CM | POA: Diagnosis not present

## 2023-11-04 DIAGNOSIS — K5900 Constipation, unspecified: Secondary | ICD-10-CM | POA: Diagnosis not present

## 2023-12-30 DIAGNOSIS — I10 Essential (primary) hypertension: Secondary | ICD-10-CM | POA: Diagnosis not present

## 2023-12-30 DIAGNOSIS — Z1339 Encounter for screening examination for other mental health and behavioral disorders: Secondary | ICD-10-CM | POA: Diagnosis not present

## 2023-12-30 DIAGNOSIS — E785 Hyperlipidemia, unspecified: Secondary | ICD-10-CM | POA: Diagnosis not present

## 2023-12-30 DIAGNOSIS — F17210 Nicotine dependence, cigarettes, uncomplicated: Secondary | ICD-10-CM | POA: Diagnosis not present

## 2023-12-30 DIAGNOSIS — Z9181 History of falling: Secondary | ICD-10-CM | POA: Diagnosis not present

## 2023-12-30 DIAGNOSIS — C61 Malignant neoplasm of prostate: Secondary | ICD-10-CM | POA: Diagnosis not present

## 2023-12-30 DIAGNOSIS — Z1331 Encounter for screening for depression: Secondary | ICD-10-CM | POA: Diagnosis not present

## 2023-12-30 DIAGNOSIS — Z79899 Other long term (current) drug therapy: Secondary | ICD-10-CM | POA: Diagnosis not present

## 2023-12-30 DIAGNOSIS — M1991 Primary osteoarthritis, unspecified site: Secondary | ICD-10-CM | POA: Diagnosis not present

## 2023-12-30 DIAGNOSIS — Z131 Encounter for screening for diabetes mellitus: Secondary | ICD-10-CM | POA: Diagnosis not present

## 2023-12-30 DIAGNOSIS — Z0000 Encounter for general adult medical examination without abnormal findings: Secondary | ICD-10-CM | POA: Diagnosis not present

## 2023-12-30 DIAGNOSIS — M1A09X0 Idiopathic chronic gout, multiple sites, without tophus (tophi): Secondary | ICD-10-CM | POA: Diagnosis not present

## 2024-01-19 DIAGNOSIS — M1712 Unilateral primary osteoarthritis, left knee: Secondary | ICD-10-CM | POA: Diagnosis not present

## 2024-01-19 DIAGNOSIS — M170 Bilateral primary osteoarthritis of knee: Secondary | ICD-10-CM | POA: Diagnosis not present

## 2024-01-19 DIAGNOSIS — M1711 Unilateral primary osteoarthritis, right knee: Secondary | ICD-10-CM | POA: Diagnosis not present

## 2024-01-26 DIAGNOSIS — M1711 Unilateral primary osteoarthritis, right knee: Secondary | ICD-10-CM | POA: Diagnosis not present

## 2024-02-03 DIAGNOSIS — D485 Neoplasm of uncertain behavior of skin: Secondary | ICD-10-CM | POA: Diagnosis not present

## 2024-02-03 DIAGNOSIS — L821 Other seborrheic keratosis: Secondary | ICD-10-CM | POA: Diagnosis not present

## 2024-02-03 DIAGNOSIS — L570 Actinic keratosis: Secondary | ICD-10-CM | POA: Diagnosis not present

## 2024-02-05 DIAGNOSIS — M1711 Unilateral primary osteoarthritis, right knee: Secondary | ICD-10-CM | POA: Diagnosis not present

## 2024-02-23 DIAGNOSIS — L821 Other seborrheic keratosis: Secondary | ICD-10-CM | POA: Diagnosis not present

## 2024-02-23 DIAGNOSIS — L578 Other skin changes due to chronic exposure to nonionizing radiation: Secondary | ICD-10-CM | POA: Diagnosis not present

## 2024-02-23 DIAGNOSIS — D225 Melanocytic nevi of trunk: Secondary | ICD-10-CM | POA: Diagnosis not present

## 2024-03-02 DIAGNOSIS — F17210 Nicotine dependence, cigarettes, uncomplicated: Secondary | ICD-10-CM | POA: Diagnosis not present

## 2024-03-02 DIAGNOSIS — G4733 Obstructive sleep apnea (adult) (pediatric): Secondary | ICD-10-CM | POA: Diagnosis not present

## 2024-03-08 DIAGNOSIS — S76011A Strain of muscle, fascia and tendon of right hip, initial encounter: Secondary | ICD-10-CM | POA: Diagnosis not present

## 2024-03-10 DIAGNOSIS — Z122 Encounter for screening for malignant neoplasm of respiratory organs: Secondary | ICD-10-CM | POA: Diagnosis not present

## 2024-03-10 DIAGNOSIS — Z87891 Personal history of nicotine dependence: Secondary | ICD-10-CM | POA: Diagnosis not present

## 2024-03-11 DIAGNOSIS — Z87891 Personal history of nicotine dependence: Secondary | ICD-10-CM | POA: Diagnosis not present

## 2024-03-15 DIAGNOSIS — Z9889 Other specified postprocedural states: Secondary | ICD-10-CM | POA: Diagnosis not present

## 2024-03-15 DIAGNOSIS — E7800 Pure hypercholesterolemia, unspecified: Secondary | ICD-10-CM | POA: Diagnosis not present

## 2024-03-15 DIAGNOSIS — I10 Essential (primary) hypertension: Secondary | ICD-10-CM | POA: Diagnosis not present

## 2024-03-15 DIAGNOSIS — I6523 Occlusion and stenosis of bilateral carotid arteries: Secondary | ICD-10-CM | POA: Diagnosis not present

## 2024-04-04 DIAGNOSIS — G4733 Obstructive sleep apnea (adult) (pediatric): Secondary | ICD-10-CM | POA: Diagnosis not present
